# Patient Record
Sex: Female | Born: 1970
Health system: Southern US, Community
[De-identification: ages and names within clinical notes are randomized; demographics above are authoritative.]

## PROBLEM LIST (undated history)

## (undated) DIAGNOSIS — N739 Female pelvic inflammatory disease, unspecified: Secondary | ICD-10-CM

## (undated) DIAGNOSIS — J45909 Unspecified asthma, uncomplicated: Secondary | ICD-10-CM

## (undated) DIAGNOSIS — D219 Benign neoplasm of connective and other soft tissue, unspecified: Secondary | ICD-10-CM

## (undated) DIAGNOSIS — I10 Essential (primary) hypertension: Secondary | ICD-10-CM

## (undated) DIAGNOSIS — J449 Chronic obstructive pulmonary disease, unspecified: Secondary | ICD-10-CM

## (undated) DIAGNOSIS — A630 Anogenital (venereal) warts: Secondary | ICD-10-CM

## (undated) DIAGNOSIS — N289 Disorder of kidney and ureter, unspecified: Secondary | ICD-10-CM

## (undated) HISTORY — PX: OTHER SURGICAL HISTORY: SHX169

## (undated) HISTORY — DX: Female pelvic inflammatory disease, unspecified: N73.9

## (undated) HISTORY — DX: Benign neoplasm of connective and other soft tissue, unspecified: D21.9

## (undated) HISTORY — DX: Essential (primary) hypertension: I10

## (undated) HISTORY — PX: LAPAROSCOPIC TOTAL HYSTERECTOMY: SUR800

## (undated) HISTORY — DX: Anogenital (venereal) warts: A63.0

## (undated) HISTORY — PX: DILATION AND CURETTAGE OF UTERUS: SHX78

## (undated) HISTORY — PX: COLPOSCOPY: SHX161

---

## 2015-10-01 DIAGNOSIS — I10 Essential (primary) hypertension: Secondary | ICD-10-CM | POA: Insufficient documentation

## 2015-10-01 DIAGNOSIS — K219 Gastro-esophageal reflux disease without esophagitis: Secondary | ICD-10-CM | POA: Insufficient documentation

## 2016-10-05 ENCOUNTER — Emergency Department (HOSPITAL_COMMUNITY): Payer: 59

## 2016-10-05 ENCOUNTER — Emergency Department (HOSPITAL_COMMUNITY)
Admission: EM | Admit: 2016-10-05 | Discharge: 2016-10-05 | Disposition: A | Discharge status: Home / Self Care | Payer: 59 | Attending: Emergency Medicine | Admitting: Emergency Medicine

## 2016-10-05 ENCOUNTER — Encounter (HOSPITAL_COMMUNITY): Payer: Self-pay | Admitting: Emergency Medicine

## 2016-10-05 DIAGNOSIS — J4521 Mild intermittent asthma with (acute) exacerbation: Secondary | ICD-10-CM | POA: Insufficient documentation

## 2016-10-05 DIAGNOSIS — R1032 Left lower quadrant pain: Secondary | ICD-10-CM | POA: Diagnosis present

## 2016-10-05 DIAGNOSIS — J449 Chronic obstructive pulmonary disease, unspecified: Secondary | ICD-10-CM | POA: Diagnosis not present

## 2016-10-05 DIAGNOSIS — N12 Tubulo-interstitial nephritis, not specified as acute or chronic: Secondary | ICD-10-CM | POA: Diagnosis not present

## 2016-10-05 DIAGNOSIS — Z79899 Other long term (current) drug therapy: Secondary | ICD-10-CM | POA: Insufficient documentation

## 2016-10-05 HISTORY — DX: Unspecified asthma, uncomplicated: J45.909

## 2016-10-05 HISTORY — DX: Chronic obstructive pulmonary disease, unspecified: J44.9

## 2016-10-05 LAB — COMPREHENSIVE METABOLIC PANEL
ALBUMIN: 4 g/dL (ref 3.5–5.0)
ALT: 16 U/L (ref 14–54)
ANION GAP: 6 (ref 5–15)
AST: 17 U/L (ref 15–41)
Alkaline Phosphatase: 49 U/L (ref 38–126)
BILIRUBIN TOTAL: 0.6 mg/dL (ref 0.3–1.2)
BUN: 12 mg/dL (ref 6–20)
CO2: 25 mmol/L (ref 22–32)
Calcium: 9.4 mg/dL (ref 8.9–10.3)
Chloride: 109 mmol/L (ref 101–111)
Creatinine, Ser: 0.97 mg/dL (ref 0.44–1.00)
GFR calc Af Amer: >60 mL/min (ref >60)
GFR calc non Af Amer: >60 mL/min (ref >60)
GLUCOSE: 93 mg/dL (ref 65–99)
POTASSIUM: 3.5 mmol/L (ref 3.5–5.1)
Sodium: 140 mmol/L (ref 135–145)
TOTAL PROTEIN: 7.6 g/dL (ref 6.5–8.1)

## 2016-10-05 LAB — CBC
HEMATOCRIT: 41.3 % (ref 36.0–46.0)
HEMOGLOBIN: 14.3 g/dL (ref 12.0–15.0)
MCH: 30.6 pg (ref 26.0–34.0)
MCHC: 34.6 g/dL (ref 30.0–36.0)
MCV: 88.2 fL (ref 78.0–100.0)
Platelets: 326 10*3/uL (ref 150–400)
RBC: 4.68 MIL/uL (ref 3.87–5.11)
RDW: 13.5 % (ref 11.5–15.5)
WBC: 25.1 10*3/uL — AB (ref 4.0–10.5)

## 2016-10-05 LAB — URINALYSIS, ROUTINE W REFLEX MICROSCOPIC
Glucose, UA: NEGATIVE mg/dL
Ketones, ur: NEGATIVE mg/dL
NITRITE: POSITIVE — AB
PH: 6 (ref 5.0–8.0)
Protein, ur: >300 mg/dL — AB
SPECIFIC GRAVITY, URINE: 1.036 — AB (ref 1.005–1.030)

## 2016-10-05 LAB — WET PREP, GENITAL
Sperm: NONE SEEN
TRICH WET PREP: NONE SEEN
YEAST WET PREP: NONE SEEN

## 2016-10-05 LAB — I-STAT BETA HCG BLOOD, ED (MC, WL, AP ONLY): I-stat hCG, quantitative: <5 m[IU]/mL (ref <5)

## 2016-10-05 LAB — LIPASE, BLOOD: Lipase: 14 U/L (ref 11–51)

## 2016-10-05 LAB — URINE MICROSCOPIC-ADD ON

## 2016-10-05 MED ORDER — FLUCONAZOLE 150 MG PO TABS: 150.0000 mg | ORAL_TABLET | Freq: Every day | ORAL | 0 refills | Status: DC

## 2016-10-05 MED ORDER — ALBUTEROL SULFATE (2.5 MG/3ML) 0.083% IN NEBU
5.0000 mg | INHALATION_SOLUTION | Freq: Once | RESPIRATORY_TRACT | Status: AC
  Administered 2016-10-05: 5 mg via RESPIRATORY_TRACT
  Filled 2016-10-05: qty 6

## 2016-10-05 MED ORDER — DEXTROSE 5 % IV SOLN
1.0000 g | Freq: Once | INTRAVENOUS | Status: AC
  Administered 2016-10-05: 1 g via INTRAVENOUS
  Filled 2016-10-05: qty 10

## 2016-10-05 MED ORDER — ONDANSETRON HCL 4 MG PO TABS: 4.0000 mg | ORAL_TABLET | Freq: Three times a day (TID) | ORAL | 0 refills | Status: DC | PRN

## 2016-10-05 MED ORDER — SULFAMETHOXAZOLE-TRIMETHOPRIM 800-160 MG PO TABS: 1.0000 | ORAL_TABLET | Freq: Two times a day (BID) | ORAL | 0 refills | Status: AC

## 2016-10-05 NOTE — ED Provider Notes (Signed)
[place] DEPT Provider Note   CSN: [id number] Arrival date & time: 10/05/16  1326     History   Chief Complaint Chief Complaint  Patient presents with  . Hematuria  . Wheezing    HPI Deborah Cordova is a 45 y.o. female.  HPI   45 year old female with history of asthma and COPD presents with multiple complaints. Patient report this morning when urinating she noticed specks of blood when she wipes. She also urinates several times each time with small amount of blood. She complaining of having sharp pain to the left groin and radiates to her left low pelvis intermittently for the past 2-3 days prior to these episodes.  Nothing suspect the pain better or worse. Pain is associated with occasional bouts of dizziness and mild throbbing headache. Furthermore, patient report increased wheezing with occasional coughing and shortness of breath for the past week, having to use the inhaler more frequently. Denies fever, chills, change in appetite, vaginal bleeding, vaginal discharge, or rash. Report remote history of kidney stones.  Past Medical History:  Diagnosis Date  . Asthma   . COPD (chronic obstructive pulmonary disease) (HCC)     There are no active problems to display for this patient.   No past surgical history on file.  OB History    No data available       Home Medications    Prior to Admission medications   Not on File    Family History No family history on file.  Social History Social History  Substance Use Topics  . Smoking status: Not on file  . Smokeless tobacco: Not on file  . Alcohol use Not on file     Allergies   Aspirin and Nsaids   Review of Systems Review of Systems  All other systems reviewed and are negative.    Physical Exam Updated Vital Signs BP 142/81 (BP Location: Right Arm)   Pulse 89   Temp 98.8 F (37.1 C) (Oral)   Resp 18   SpO2 100%   Physical Exam  Constitutional: She appears well-developed and well-nourished.  No distress.  HENT:  Head: Atraumatic.  Right Ear: External ear normal.  Left Ear: External ear normal.  Nose: Nose normal.  Mouth/Throat: Oropharynx is clear and moist.  Eyes: Conjunctivae are normal.  Neck: Neck supple.  Cardiovascular: Normal rate and regular rhythm.   Pulmonary/Chest: She has wheezes (Faint scattered wheezes, no rales or rhonchi heard).  Abdominal: Soft. There is tenderness (Tenderness to suprapubic and left lower quadrant on palpation, no guarding rebound tenderness.).  Genitourinary:  Genitourinary Comments: Bilateral CVA tenderness on percussion.  Chaperone and present during exam. No inguinal lymphadenopathy or inguinal hernia noted. Normal external genitalia. No pain with speculum insertion. White functional discharge noted in vaginal vault. Cervix is absent. Mild Left adnexal tenderness.  No blood visualized.  Neurological: She is alert.  Skin: No rash noted.  Psychiatric: She has a normal mood and affect.  Nursing note and vitals reviewed.    ED Treatments / Results  Labs (all labs ordered are listed, but only abnormal results are displayed) Labs Reviewed  CBC - Abnormal; Notable for the following:       Result Value   WBC 25.1 (*)    All other components within normal limits  URINALYSIS, ROUTINE W REFLEX MICROSCOPIC (NOT AT [place]) - Abnormal; Notable for the following:    Color, Urine RED (*)    APPearance TURBID (*)    Specific Gravity, Urine 1.036 (*)  Hgb urine dipstick LARGE (*)    Bilirubin Urine MODERATE (*)    Protein, ur >300 (*)    Nitrite POSITIVE (*)    Leukocytes, UA SMALL (*)    All other components within normal limits  URINE MICROSCOPIC-ADD ON - Abnormal; Notable for the following:    Squamous Epithelial / LPF 6-30 (*)    Bacteria, UA MANY (*)    All other components within normal limits  WET PREP, GENITAL  URINE CULTURE  LIPASE, BLOOD  COMPREHENSIVE METABOLIC PANEL  I-STAT BETA HCG BLOOD, ED ([facility], [facility], [facility] ONLY)    GC/CHLAMYDIA PROBE AMP ([place]) NOT AT [facility]    EKG  EKG Interpretation None       Radiology Dg Chest 2 View  Result Date: 10/05/2016 CLINICAL DATA:  Dizziness, headache, shortness of breath cough and congestion. EXAM: CHEST  2 VIEW COMPARISON:  None. FINDINGS: The heart size and mediastinal contours are within normal limits. Both lungs are clear. The visualized skeletal structures are unremarkable. IMPRESSION: No active cardiopulmonary disease. Electronically Signed   By: Fidela Salisbury M.D.   On: 10/05/2016 14:27   Ct Renal Stone Study  Result Date: 10/05/2016 CLINICAL DATA:  Cough of several days duration. Left lower quadrant abdominal pain with dizziness and headache. Gross hematuria. EXAM: CT ABDOMEN AND PELVIS WITHOUT CONTRAST TECHNIQUE: Multidetector CT imaging of the abdomen and pelvis was performed following the standard protocol without IV contrast. COMPARISON:  None. FINDINGS: Lower chest: Lung bases are clear.  No pleural or pericardial fluid. Hepatobiliary: Normal Pancreas: Normal Spleen: Normal Adrenals/Urinary Tract: Normal Stomach/Bowel: Normal Vascular/Lymphatic: Normal Reproductive: Previous hysterectomy. Small amount of free fluid in the pelvis. Other: None significant Musculoskeletal: Negative IMPRESSION: No evidence of urinary tract pathology.  No stone disease. Small amount of free fluid in the pelvic cul de sac. In a person of this age, this may be secondary to a ruptured ovarian cyst. No other intra abdominal pathology evident to explain symptoms or fluid. Electronically Signed   By: Nelson Chimes M.D.   On: 10/05/2016 16:05    Procedures Procedures (including critical care time)  Medications Ordered in ED Medications  cefTRIAXone (ROCEPHIN) 1 g in dextrose 5 % 50 mL IVPB (not administered)  albuterol (PROVENTIL) (2.5 MG/3ML) 0.083% nebulizer solution 5 mg (5 mg Nebulization Given 10/05/16 1400)     Initial Impression / Assessment and Plan / ED Course   I have reviewed the triage vital signs and the nursing notes.  Pertinent labs & imaging results that were available during my care of the patient were reviewed by me and considered in my medical decision making (see chart for details).  Clinical Course    BP 125/61 (BP Location: Right Arm)   Pulse 72   Temp 98.8 F (37.1 C) (Oral)   Resp 16   SpO2 100%    Final Clinical Impressions(s) / ED Diagnoses   Final diagnoses:  Pyelonephritis  Mild intermittent asthma with exacerbation    New Prescriptions New Prescriptions   ONDANSETRON (ZOFRAN) 4 MG TABLET    Take 1 tablet (4 mg total) by mouth every 8 (eight) hours as needed for nausea or vomiting.   SULFAMETHOXAZOLE-TRIMETHOPRIM (BACTRIM DS,SEPTRA DS) 800-160 MG TABLET    Take 1 tablet by mouth 2 (two) times daily.   2:47 PM Patient with multiple complaints. Her primary concern is hematuria. She does have some tenderness to her low abdomen and CVA tenderness. Report remote history of kidney stone, which check UA and perform  CT renal stone study. She also has history of COPD and asthma and has been complaining of wheezing cough for the past week. A chest x-ray is negative for pneumonia, which she is in no acute respiratory discomfort and maintain oxygenation of 100% some room air. Reassurance given.  4:31 PM Patient has elevated white count of 25.1. Her urine shows evidence consistence with a urinary tract infection which explains symptoms. Says she has low back pain, infected urine, and elevated white count, she will be diagnosed with pyelonephritis. CT scan of abd/pelvis without acute finding. Patient receiving Rocephin IV in the ED. Patient does not wants to be admitted, she is not actively vomiting, and she felt comfortable going home. She is afebrile with stable normal vital sign. Care discussed with oncoming provider who will discharge patient once she received antibiotic. At this time, wet prep has not resulted yet.   Domenic Moras,  PA-C 10/05/16 1638    Milton Ferguson, MD 10/06/16 1434

## 2016-10-05 NOTE — Discharge Instructions (Signed)
You have been evaluated for your symptoms.  You have been diagnosed with having kidney infection.  Please take antibiotic as prescribed for the full duration.  Take zofran as needed for nausea.  Continue to use your inhaler for your asthma exacerbation.  Return to the ER if your condition worsen or if you have other concerns.

## 2016-10-05 NOTE — ED Triage Notes (Signed)
Pt c/o cough x several days, wheezing x 4 days, lower LLQ abdominal pain x 3 days, dizziness, headache. This morning new onset sharp pain radiating from groin to lower abdomen and bright red blood clots in urine. No dysuria.

## 2016-10-06 LAB — GC/CHLAMYDIA PROBE AMP (~~LOC~~) NOT AT ARMC
CHLAMYDIA, DNA PROBE: NEGATIVE
NEISSERIA GONORRHEA: NEGATIVE

## 2016-10-08 LAB — URINE CULTURE

## 2016-10-09 ENCOUNTER — Telehealth (HOSPITAL_BASED_OUTPATIENT_CLINIC_OR_DEPARTMENT_OTHER): Payer: Self-pay | Admitting: Emergency Medicine

## 2016-10-09 NOTE — Progress Notes (Signed)
ED Antimicrobial Stewardship Positive Culture Follow Up   Deborah Cordova is an 45 y.o. female who presented to [facility] on 10/05/2016 with a chief complaint of  Chief Complaint  Patient presents with  . Hematuria  . Wheezing    Recent Results (from the past 720 hour(s))  Urine culture     Status: Abnormal   Collection Time: 10/05/16  1:55 PM  Result Value Ref Range Status   Specimen Description URINE, RANDOM  Final   Special Requests NONE  Final   Culture >=100,000 COLONIES/mL ESCHERICHIA COLI (A)  Final   Report Status 10/08/2016 FINAL  Final   Organism ID, Bacteria ESCHERICHIA COLI (A)  Final      Susceptibility   Escherichia coli - MIC*    AMPICILLIN >=32 RESISTANT Resistant     CEFAZOLIN <=4 SENSITIVE Sensitive     CEFTRIAXONE <=1 SENSITIVE Sensitive     CIPROFLOXACIN <=0.25 SENSITIVE Sensitive     GENTAMICIN <=1 SENSITIVE Sensitive     IMIPENEM <=0.25 SENSITIVE Sensitive     NITROFURANTOIN <=16 SENSITIVE Sensitive     TRIMETH/SULFA >=320 RESISTANT Resistant     AMPICILLIN/SULBACTAM 16 INTERMEDIATE Intermediate     PIP/TAZO <=4 SENSITIVE Sensitive     Extended ESBL NEGATIVE Sensitive     * >=100,000 COLONIES/mL ESCHERICHIA COLI  Wet prep, genital     Status: Abnormal   Collection Time: 10/05/16  4:11 PM  Result Value Ref Range Status   Yeast Wet Prep HPF POC NONE SEEN NONE SEEN Final   Trich, Wet Prep NONE SEEN NONE SEEN Final   Clue Cells Wet Prep HPF POC PRESENT (A) NONE SEEN Final   WBC, Wet Prep HPF POC FEW (A) NONE SEEN Final   Sperm NONE SEEN  Final    [x]  Treated with Bactrim, organism resistant to prescribed antimicrobial  New antibiotic prescription: Stop Bactrim. Start ciprofloxacin 500 mg PO BID for 7 days.  ED Provider: Janetta Hora, PA-C   Gwenlyn Perking, PharmD PGY1 Pharmacy Resident Pager: [phone] 10/09/2016 8:55 AM

## 2016-10-09 NOTE — Telephone Encounter (Signed)
Post ED Visit - Positive Culture Follow-up: Successful Patient Follow-Up  Culture assessed and recommendations reviewed by: []  Elenor Quinones, Pharm.D. []  Heide Guile, Pharm.D., BCPS []  Parks Neptune, Pharm.D. []  Alycia Rossetti, Pharm.D., BCPS []  Dexter, Pharm.D., BCPS, AAHIVP []  Legrand Como, Pharm.D., BCPS, AAHIVP []  Milus Glazier, Pharm.D. []  Stephens November, Pharm.D.  Positive culture  []  Patient discharged without antimicrobial prescription and treatment is now indicated [x]  Organism is resistant to prescribed ED discharge antimicrobial []  Patient with positive blood cultures  Changes discussed with ED provider: Janetta Hora PA New antibiotic prescription stop Bactrim, Start Ciprofloxacin 500mg  po bid x 7 days Called to [place]  10/09/2016 1506   Deborah Cordova 10/09/2016, 3:03 PM

## 2016-10-23 ENCOUNTER — Encounter: Payer: Self-pay | Admitting: Obstetrics and Gynecology

## 2016-10-24 ENCOUNTER — Telehealth: Payer: Self-pay | Admitting: *Deleted

## 2016-10-24 NOTE — Telephone Encounter (Signed)
Spoke with patient in reference to MyChart message as seen below. Patient sates she is having the same lower abdominal pain, has been ongoing since Hysterectomy 05/19/16. Patient states seen previous physician who preformed surgery and was advised pain was due to bladder resting on sutures. Patient states pain is worse with urination and bowel movements, pressure makes it worse, lifts stomach for relief.  Patient states she is new to area, went to ER 10/05/16 for this pain was treated for "kidney infection" with bactrimDS x7d and had "CT that showed fluid in abdomen r/t ruptured cyst" . Patient states no further recommendations made at that time. Patient states she has history of hpylori and thinks pain may be related. Patient states she has nausea daily. Reports belching a lot. Patient states not taking anything for pain relief. Recommended changing appointment time to earlier appt, offered patient appointment for 10/28/16 at 3:30pm with Dr. Talbert Nan, patient declined due to work schedule. Patient states only available 10/30 and 10/31 in the mornings -no appointments available. Patient states she will keep current appt for 10/30/16 with Dr. Talbert Nan. Advised patient should pain get worse, vomiting, fever -visit local ER or Urgent Care after hours or return call to office for questions/concerns. Patient verbalizes understanding and is agreeable.  Routing to provider for final review. Patient is agreeable to disposition. Will close encounter.    Cc: Dr. Talbert Nan

## 2016-10-24 NOTE — Telephone Encounter (Signed)
Dr. Quincy Simmonds, patient has new patient appt scheduled for 10/30/16 with Dr. Talbert Nan, does she need to be seen before?    From Deborah Cordova To Deborah Dom, MD Sent 10/23/2016 10:32 AM  I have been experiencing lower abdominal pain for about a month, I get nauseated at times as well and I'm very concerned about this. I had a hysterectomy in May and wonder if this is the source or if it could be Hpylori, I had this before years ago when I lived in [place]. This is my main issue for coming in outside of getting established. I had a CT done October 8th at [facility] and was told I have fluid in my belly, which may have came from a ruptured cyst and could be the reason for my pain. I just want this issue resolved because it doesn't feel good. Thanks and I look forward to meeting you.    Cc: Dr. Talbert Nan

## 2016-10-24 NOTE — Telephone Encounter (Signed)
Encounter to clinical supervisor. Patient has not yet established care.

## 2016-10-24 NOTE — Telephone Encounter (Signed)
See telephone encounter dated 10/24/16. Will close encounter

## 2016-10-24 NOTE — Telephone Encounter (Signed)
Try to move up her appointment with Dr. Talbert Nan for the beginning of the week.  Difficult for [place] to give advice to her without being seen in the office and assessed.  Cc- Dr. Talbert Nan

## 2016-10-24 NOTE — Telephone Encounter (Signed)
Left message to call Clorine Swing at [phone].  

## 2016-10-26 ENCOUNTER — Encounter (HOSPITAL_COMMUNITY): Payer: Self-pay | Admitting: Oncology

## 2016-10-26 ENCOUNTER — Emergency Department (HOSPITAL_COMMUNITY)
Admission: EM | Admit: 2016-10-26 | Discharge: 2016-10-27 | Disposition: A | Discharge status: Home / Self Care | Payer: 59 | Attending: Emergency Medicine | Admitting: Emergency Medicine

## 2016-10-26 DIAGNOSIS — R102 Pelvic and perineal pain: Secondary | ICD-10-CM | POA: Insufficient documentation

## 2016-10-26 DIAGNOSIS — R110 Nausea: Secondary | ICD-10-CM | POA: Insufficient documentation

## 2016-10-26 DIAGNOSIS — Z79899 Other long term (current) drug therapy: Secondary | ICD-10-CM | POA: Diagnosis not present

## 2016-10-26 DIAGNOSIS — Z87891 Personal history of nicotine dependence: Secondary | ICD-10-CM | POA: Insufficient documentation

## 2016-10-26 DIAGNOSIS — J449 Chronic obstructive pulmonary disease, unspecified: Secondary | ICD-10-CM | POA: Diagnosis not present

## 2016-10-26 DIAGNOSIS — R109 Unspecified abdominal pain: Secondary | ICD-10-CM | POA: Diagnosis present

## 2016-10-26 MED ORDER — MORPHINE SULFATE (PF) 2 MG/ML IV SOLN
4.0000 mg | Freq: Once | INTRAVENOUS | Status: AC
Start: 2016-10-27 — End: 2016-10-27
  Administered 2016-10-27: 4 mg via INTRAVENOUS
  Filled 2016-10-26: qty 2

## 2016-10-26 MED ORDER — ONDANSETRON HCL 4 MG/2ML IJ SOLN
4.0000 mg | Freq: Once | INTRAMUSCULAR | Status: AC
  Administered 2016-10-27: 4 mg via INTRAVENOUS
  Filled 2016-10-26: qty 2

## 2016-10-26 MED ORDER — SODIUM CHLORIDE 0.9 % IV BOLUS (SEPSIS)
1000.0000 mL | Freq: Once | INTRAVENOUS | Status: DC
  Administered 2016-10-27: 1000 mL via INTRAVENOUS

## 2016-10-26 NOTE — ED Triage Notes (Signed)
Pt c/o left sided flank/back pain.  Pt recently dx w/ kidney infection.  +nausea.

## 2016-10-26 NOTE — ED Provider Notes (Signed)
[place] DEPT Provider Note   CSN: [id number] Arrival date & time: 10/26/16  2302     History   Chief Complaint Chief Complaint  Patient presents with  . Flank Pain     HPI   Blood pressure 142/90, pulse 91, temperature 97.7 F (36.5 C), temperature source Oral, resp. rate 18, SpO2 99 %.  Deborah Cordova is a 45 y.o. female complaining of left lower quadrant pain intermittently onset one month ago, she also has left flank pain recent restarted in the last day. She denies fever, chills endorses nausea with no vomiting, denies abnormal vaginal discharge, diarrhea, melena, hematochezia. States that she was seen 3 weeks ago and diagnosed with pyelonephritis, she completed her course of antibiotics and had improvement in her symptoms. States that she's had lower abdominal pain for 5 months status post hysterectomy: It is normally on the right side however.   Past Medical History:  Diagnosis Date  . Asthma   . COPD (chronic obstructive pulmonary disease) (HCC)     There are no active problems to display for this patient.   Past Surgical History:  Procedure Laterality Date  . ABDOMINAL HYSTERECTOMY      OB History    No data available       Home Medications    Prior to Admission medications   Medication Sig Start Date End Date Taking? Authorizing Provider  acetaminophen (TYLENOL) 500 MG tablet Take 1,000 mg by mouth every 6 (six) hours as needed for headache.   Yes Historical Provider, MD  acyclovir (ZOVIRAX) 400 MG tablet Take 400 mg by mouth as needed (For fever blisters.).   Yes Historical Provider, MD  albuterol (PROAIR HFA) 108 (90 Base) MCG/ACT inhaler Inhale 2 puffs into the lungs every 4 (four) hours as needed for wheezing or shortness of breath.   Yes Historical Provider, MD  amLODipine (NORVASC) 5 MG tablet Take 5 mg by mouth daily.   Yes Historical Provider, MD  butalbital-acetaminophen-caffeine (FIORICET, ESGIC) 50-325-40 MG tablet Take 0.5 tablets by  mouth 2 (two) times daily as needed for headache.   Yes Historical Provider, MD  cetirizine (ZYRTEC ALLERGY) 10 MG tablet Take 10 mg by mouth daily.   Yes Historical Provider, MD  gabapentin (NEURONTIN) 300 MG capsule Take 300 mg by mouth daily as needed (restless leg).   Yes Historical Provider, MD  montelukast (SINGULAIR) 10 MG tablet Take 10 mg by mouth daily.   Yes Historical Provider, MD  omalizumab Arvid Right) 150 MG injection Inject 300 mg into the skin every 14 (fourteen) days.   Yes Historical Provider, MD  ondansetron (ZOFRAN) 4 MG tablet Take 1 tablet (4 mg total) by mouth every 8 (eight) hours as needed for nausea or vomiting. 10/05/16  Yes Domenic Moras, PA-C  ranitidine (ZANTAC) 150 MG tablet Take 150 mg by mouth 2 (two) times daily.   Yes Historical Provider, MD  tiotropium (SPIRIVA) 18 MCG inhalation capsule Place 18 mcg into inhaler and inhale daily.   Yes Historical Provider, MD  fluconazole (DIFLUCAN) 150 MG tablet Take 1 tablet (150 mg total) by mouth daily. Patient not taking: Reported on 10/27/2016 10/05/16   Carlisle Cater, PA-C    Family History No family history on file.  Social History Social History  Substance Use Topics  . Smoking status: Former Research scientist (life sciences)  . Smokeless tobacco: Never Used  . Alcohol use Yes     Allergies   Aspirin; Cantaloupe (diagnostic); Kiwi extract; Nsaids; Other; and Pineapple   Review of Systems  Review of Systems  10 systems reviewed and found to be negative, except as noted in the HPI.   Physical Exam Updated Vital Signs BP 142/90 (BP Location: Left Arm)   Pulse 91   Temp 97.7 F (36.5 C) (Oral)   Resp 18   SpO2 99%   Physical Exam  Constitutional: She is oriented to person, place, and time. She appears well-developed and well-nourished. No distress.  HENT:  Head: Normocephalic and atraumatic.  Mouth/Throat: Oropharynx is clear and moist.  Eyes: Conjunctivae and EOM are normal. Pupils are equal, round, and reactive to light.    Neck: Normal range of motion.  Cardiovascular: Normal rate, regular rhythm and intact distal pulses.   Pulmonary/Chest: Effort normal and breath sounds normal.  Abdominal: Soft. There is tenderness.  Tender to palpation in the suprapubic and left lower quadrant without guarding or rebound, mild left CVA tenderness to percussion.  Musculoskeletal: Normal range of motion.  Neurological: She is alert and oriented to person, place, and time.  Skin: She is not diaphoretic.  Psychiatric: She has a normal mood and affect.  Nursing note and vitals reviewed.    ED Treatments / Results  Labs (all labs ordered are listed, but only abnormal results are displayed) Labs Reviewed  CBC WITH DIFFERENTIAL/PLATELET - Abnormal; Notable for the following:       Result Value   WBC 11.0 (*)    Lymphs Abs 4.4 (*)    All other components within normal limits  WET PREP, GENITAL  URINALYSIS, ROUTINE W REFLEX MICROSCOPIC (NOT AT [facility])  COMPREHENSIVE METABOLIC PANEL  LIPASE, BLOOD  GC/CHLAMYDIA PROBE AMP ([place]) NOT AT [facility]    EKG  EKG Interpretation None       Radiology No results found.  Procedures Procedures (including critical care time)  Medications Ordered in ED Medications  sodium chloride 0.9 % bolus 1,000 mL (1,000 mLs Intravenous Not Given 10/27/16 0106)  morphine 2 MG/ML injection 4 mg (4 mg Intravenous Given 10/27/16 0041)  ondansetron (ZOFRAN) injection 4 mg (4 mg Intravenous Given 10/27/16 0039)     Initial Impression / Assessment and Plan / ED Course  I have reviewed the triage vital signs and the nursing notes.  Pertinent labs & imaging results that were available during my care of the patient were reviewed by me and considered in my medical decision making (see chart for details).  Clinical Course    Vitals:   10/26/16 2312  BP: 142/90  Pulse: 91  Resp: 18  Temp: 97.7 F (36.5 C)  TempSrc: Oral  SpO2: 99%    Medications  sodium chloride 0.9 % bolus  1,000 mL (1,000 mLs Intravenous Not Given 10/27/16 0106)  morphine 2 MG/ML injection 4 mg (4 mg Intravenous Given 10/27/16 0041)  ondansetron (ZOFRAN) injection 4 mg (4 mg Intravenous Given 10/27/16 0039)    Deborah Cordova is 45 y.o. female presenting with Left lower abdominal pain radiating up to the left flank, abdominal exam with tenderness, no surgical signs. No diarrhea to suggest a diverticulitis. Urinalysis is without infection, blood work reassuring.  Case signed out to PA Focht at shift change plan is to perform pelvic exam and pelvic ultrasound. Recommend discharge to home with Vicodin.    Final Clinical Impressions(s) / ED Diagnoses   Final diagnoses:  None    New Prescriptions New Prescriptions   No medications on file     Monico Blitz, PA-C 10/27/16 0128    Orpah Greek, MD 10/27/16 [phone]

## 2016-10-27 ENCOUNTER — Emergency Department (HOSPITAL_COMMUNITY): Payer: 59

## 2016-10-27 ENCOUNTER — Encounter: Payer: Self-pay | Admitting: Obstetrics and Gynecology

## 2016-10-27 ENCOUNTER — Telehealth: Payer: Self-pay | Admitting: Obstetrics and Gynecology

## 2016-10-27 ENCOUNTER — Ambulatory Visit (INDEPENDENT_AMBULATORY_CARE_PROVIDER_SITE_OTHER): Payer: Managed Care, Other (non HMO) | Admitting: Obstetrics and Gynecology

## 2016-10-27 VITALS — BP 122/80 | HR 80 | Resp 16 | Ht 68.75 in | Wt 216.0 lb

## 2016-10-27 DIAGNOSIS — N760 Acute vaginitis: Secondary | ICD-10-CM | POA: Diagnosis not present

## 2016-10-27 DIAGNOSIS — R3989 Other symptoms and signs involving the genitourinary system: Secondary | ICD-10-CM

## 2016-10-27 DIAGNOSIS — J4550 Severe persistent asthma, uncomplicated: Secondary | ICD-10-CM | POA: Insufficient documentation

## 2016-10-27 DIAGNOSIS — J449 Chronic obstructive pulmonary disease, unspecified: Secondary | ICD-10-CM | POA: Insufficient documentation

## 2016-10-27 DIAGNOSIS — R102 Pelvic and perineal pain: Secondary | ICD-10-CM

## 2016-10-27 DIAGNOSIS — B9689 Other specified bacterial agents as the cause of diseases classified elsewhere: Secondary | ICD-10-CM | POA: Diagnosis not present

## 2016-10-27 DIAGNOSIS — N941 Unspecified dyspareunia: Secondary | ICD-10-CM | POA: Diagnosis not present

## 2016-10-27 DIAGNOSIS — L293 Anogenital pruritus, unspecified: Secondary | ICD-10-CM

## 2016-10-27 DIAGNOSIS — R339 Retention of urine, unspecified: Secondary | ICD-10-CM | POA: Diagnosis not present

## 2016-10-27 LAB — COMPREHENSIVE METABOLIC PANEL
ALBUMIN: 3.9 g/dL (ref 3.5–5.0)
ALT: 18 U/L (ref 14–54)
AST: 16 U/L (ref 15–41)
Alkaline Phosphatase: 51 U/L (ref 38–126)
Anion gap: 6 (ref 5–15)
BUN: 10 mg/dL (ref 6–20)
CHLORIDE: 107 mmol/L (ref 101–111)
CO2: 26 mmol/L (ref 22–32)
Calcium: 9.2 mg/dL (ref 8.9–10.3)
Creatinine, Ser: 0.65 mg/dL (ref 0.44–1.00)
GFR calc Af Amer: >60 mL/min (ref >60)
Glucose, Bld: 97 mg/dL (ref 65–99)
POTASSIUM: 3.5 mmol/L (ref 3.5–5.1)
SODIUM: 139 mmol/L (ref 135–145)
Total Bilirubin: 0.5 mg/dL (ref 0.3–1.2)
Total Protein: 7 g/dL (ref 6.5–8.1)

## 2016-10-27 LAB — URINALYSIS, ROUTINE W REFLEX MICROSCOPIC
BILIRUBIN URINE: NEGATIVE
Glucose, UA: NEGATIVE mg/dL
HGB URINE DIPSTICK: NEGATIVE
KETONES UR: NEGATIVE mg/dL
Leukocytes, UA: NEGATIVE
NITRITE: NEGATIVE
PROTEIN: NEGATIVE mg/dL
Specific Gravity, Urine: 1.016 (ref 1.005–1.030)
pH: 7 (ref 5.0–8.0)

## 2016-10-27 LAB — CBC WITH DIFFERENTIAL/PLATELET
BASOS ABS: 0.1 10*3/uL (ref 0.0–0.1)
BASOS PCT: 1 %
EOS ABS: 0.2 10*3/uL (ref 0.0–0.7)
EOS PCT: 2 %
HCT: 37.7 % (ref 36.0–46.0)
Hemoglobin: 12.8 g/dL (ref 12.0–15.0)
Lymphocytes Relative: 40 %
Lymphs Abs: 4.4 10*3/uL — ABNORMAL HIGH (ref 0.7–4.0)
MCH: 29.8 pg (ref 26.0–34.0)
MCHC: 34 g/dL (ref 30.0–36.0)
MCV: 87.7 fL (ref 78.0–100.0)
MONO ABS: 0.7 10*3/uL (ref 0.1–1.0)
Monocytes Relative: 6 %
Neutro Abs: 5.7 10*3/uL (ref 1.7–7.7)
Neutrophils Relative %: 51 %
PLATELETS: 333 10*3/uL (ref 150–400)
RBC: 4.3 MIL/uL (ref 3.87–5.11)
RDW: 13.2 % (ref 11.5–15.5)
WBC: 11 10*3/uL — ABNORMAL HIGH (ref 4.0–10.5)

## 2016-10-27 LAB — WET PREP, GENITAL
Sperm: NONE SEEN
Trich, Wet Prep: NONE SEEN
Yeast Wet Prep HPF POC: NONE SEEN

## 2016-10-27 LAB — I-STAT TROPONIN, ED: Troponin i, poc: 0 ng/mL (ref 0.00–0.08)

## 2016-10-27 LAB — LIPASE, BLOOD: LIPASE: 17 U/L (ref 11–51)

## 2016-10-27 MED ORDER — HYDROCODONE-ACETAMINOPHEN 5-325 MG PO TABS: 1.0000 | ORAL_TABLET | Freq: Four times a day (QID) | ORAL | 0 refills | Status: DC | PRN

## 2016-10-27 MED ORDER — SODIUM CHLORIDE 0.9 % IV BOLUS (SEPSIS)
500.0000 mL | Freq: Once | INTRAVENOUS | Status: AC
  Administered 2016-10-27: 500 mL via INTRAVENOUS

## 2016-10-27 MED ORDER — SODIUM CHLORIDE 0.9 % IV BOLUS (SEPSIS): 1000.0000 mL | Freq: Once | INTRAVENOUS | Status: DC

## 2016-10-27 MED ORDER — METRONIDAZOLE 500 MG PO TABS: 500.0000 mg | ORAL_TABLET | Freq: Two times a day (BID) | ORAL | 0 refills | Status: DC

## 2016-10-27 MED ORDER — DIPHENHYDRAMINE HCL 50 MG/ML IJ SOLN
25.0000 mg | Freq: Once | INTRAMUSCULAR | Status: AC
  Administered 2016-10-27: 25 mg via INTRAVENOUS
  Filled 2016-10-27: qty 1

## 2016-10-27 MED ORDER — BETAMETHASONE VALERATE 0.1 % EX OINT: TOPICAL_OINTMENT | CUTANEOUS | 0 refills | Status: DC

## 2016-10-27 MED ORDER — FLUCONAZOLE 150 MG PO TABS: 150.0000 mg | ORAL_TABLET | Freq: Once | ORAL | 0 refills | Status: AC

## 2016-10-27 MED ORDER — MORPHINE SULFATE (PF) 2 MG/ML IV SOLN
4.0000 mg | Freq: Once | INTRAVENOUS | Status: AC
  Administered 2016-10-27: 4 mg via INTRAVENOUS
  Filled 2016-10-27: qty 2

## 2016-10-27 NOTE — ED Notes (Signed)
Pt stated "the pain in my stomach is 2 and I don't really feel the pain in my back."

## 2016-10-27 NOTE — ED Notes (Signed)
US in with pt. 

## 2016-10-27 NOTE — ED Notes (Signed)
PA-C informed of pt itching.

## 2016-10-27 NOTE — ED Notes (Signed)
Pt now c/o itching.

## 2016-10-27 NOTE — Discharge Instructions (Signed)
Take the Norco as needed for pain and be sure to add a stool softener to prevent constipation. Call your OB/GYN today to inform them of your visit to the emergency department and keep your appointment for Thursday. Return to the emergency department if you experience uncontrolled pain, fevers, vomiting or any other concerning symptoms.

## 2016-10-27 NOTE — Telephone Encounter (Signed)
Spoke with patient. (Also see telephone encounter dated 10/24/16) Patient states was seen in ER 10/26/16 for left sided lower abdominal pain. Patient states PUS preformed and recommended follow-up with OB. Patient scheduled for New patient appointment 10/30/16 at 9:30 am with Dr. Talbert Cordova. Patient would like to be seen before this appointment. Patient states she is available for OV today, no appointment available for New GYN. Advised Dr. Talbert Cordova in surgery this morning, will review with Dr. Talbert Cordova and return call with recommendations. Patient is agreeable.    Dr. Talbert Cordova, please advise? There is an OV-15 min appointment time available for today? This is a new patient scheduled for New GYN appt on 10/30/16.

## 2016-10-27 NOTE — ED Notes (Signed)
RN to draw labs with start of IV

## 2016-10-27 NOTE — ED Notes (Signed)
Pt stated "I had a hysterectomy 5-22 and I've had stomach pain since.  I hurt in my left lower side and around in the lower part of my back."

## 2016-10-27 NOTE — Progress Notes (Signed)
45 y.o. [id number] MarriedAfrican AmericanF here for annual exam. She had a laparoscopic total hysterectomy with bilateral salpingectomies in 5/17 in [place], [place]. 6-8 weeks ago she started having a constant pain in her suprapubic region to LLQ. Worse with a full bladder, or if she has to have a BM. The pain can pulsate and throb. The constant pain is an ache. Pain ranges from a 7-10/10 in severity. Yesterday she started noticing some pain in her left flank pain, currently better.  She does c/o nausea for this whole 6-8 weeks off and on, able to eat. She has 3 soft BMs a day, long term. No fevers, no emesis, hurts in her abdomen to void. No frequency or urgency to void. No burning with urination.  She was seen in the ER yesterday with LLQ abdominal pain. U/S showed a normal right ovary and left ovary with a 2.2 cm complex cyst containing 1 smooth septum. No signs of torsion. Urine dip was negative. WBC was 11 (down from 25 3 weeks ago). She was noted to have BV in the ER, not treated, she denied abnormal vaginal d/c or odor. She does c/o genital pruritus She states her voiding hasn't been the same since her surgery. Yesterday the ultrasound tech told her that her bladder looked full right after she voided.  She hasn't been sexually active for the last several months secondary to pain.   She was treated for a pyelonephritis on 10/05/16.    No LMP recorded. Patient has had a hysterectomy.          Sexually active: Yes.    The current method of family planning is status post hysterectomy.    Exercising: Yes.    Walking treadmill Smoker:  Former smoker   Health Maintenance: Pap:  03/2016 WNL per patient  History of abnormal Pap:  yes MMG:  2016 WNL per patient Colonoscopy:  Never BMD:   Never TDaP:  09-08-16  Gardasil: N/A   reports that she has quit smoking. She has never used smokeless tobacco. She reports that she drinks about 0.6 - 1.8 oz of alcohol per week . She reports that she does not use  drugs.She just started working for [employer], she works in [employer]. She is a CMA.   Past Medical History:  Diagnosis Date  . Asthma   . COPD (chronic obstructive pulmonary disease) ([place])     Past Surgical History:  Procedure Laterality Date  . ABDOMINAL HYSTERECTOMY      Current Outpatient Prescriptions  Medication Sig Dispense Refill  . acetaminophen (TYLENOL) 500 MG tablet Take 1,000 mg by mouth every 6 (six) hours as needed for headache.    Marland Kitchen acyclovir (ZOVIRAX) 400 MG tablet Take 400 mg by mouth as needed (For fever blisters.).    Marland Kitchen albuterol (PROAIR HFA) 108 (90 Base) MCG/ACT inhaler Inhale 2 puffs into the lungs every 4 (four) hours as needed for wheezing or shortness of breath.    Marland Kitchen amLODipine (NORVASC) 5 MG tablet Take 5 mg by mouth daily.    . butalbital-acetaminophen-caffeine (FIORICET, ESGIC) 50-325-40 MG tablet Take 0.5 tablets by mouth 2 (two) times daily as needed for headache.    . cetirizine (ZYRTEC ALLERGY) 10 MG tablet Take 10 mg by mouth daily.    Marland Kitchen gabapentin (NEURONTIN) 300 MG capsule Take 300 mg by mouth daily as needed (restless leg).    Marland Kitchen HYDROcodone-acetaminophen (NORCO/VICODIN) 5-325 MG tablet Take 1-2 tablets by mouth every 6 (six) hours as needed. 24 tablet 0  .  montelukast (SINGULAIR) 10 MG tablet Take 10 mg by mouth daily.    Marland Kitchen omalizumab (XOLAIR) 150 MG injection Inject 300 mg into the skin every 14 (fourteen) days.    . ondansetron (ZOFRAN) 4 MG tablet Take 1 tablet (4 mg total) by mouth every 8 (eight) hours as needed for nausea or vomiting. 12 tablet 0  . ranitidine (ZANTAC) 150 MG tablet Take 150 mg by mouth 2 (two) times daily.    Marland Kitchen tiotropium (SPIRIVA) 18 MCG inhalation capsule Place 18 mcg into inhaler and inhale daily.     No current facility-administered medications for this visit.     No family history on file.  Review of Systems  Constitutional: Negative.   HENT: Negative.   Eyes: Negative.   Respiratory: Negative.    Cardiovascular: Negative.   Gastrointestinal: Negative.   Endocrine: Negative.   Genitourinary: Positive for pelvic pain.  Musculoskeletal: Negative.   Skin: Negative.   Allergic/Immunologic: Negative.   Neurological: Negative.   Psychiatric/Behavioral: Negative.     Exam:   BP 122/80 (BP Location: Left Arm, Patient Position: Standing, Cuff Size: Normal)   Pulse 80   Resp 16   Ht 5' 8.75" (1.746 m)   Wt 216 lb (98 kg)   BMI 32.13 kg/m   Weight change: @WEIGHTCHANGE @ Height:   Height: 5' 8.75" (174.6 cm)  Ht Readings from Last 3 Encounters:  10/27/16 5' 8.75" (1.746 m)    General appearance: alert, cooperative and appears stated age Head: Normocephalic, without obvious abnormality, atraumatic Neck: no adenopathy, supple, symmetrical, trachea midline and thyroid normal to inspection and palpation Lungs: clear to auscultation bilaterally Heart: regular rate and rhythm Abdomen: soft, very tender in the suprapubic region and the LLQ, no rebound no guarding. No masses, NABS, mildly distended.  Extremities: extremities normal, atraumatic, no cyanosis or edema Skin: Skin color, texture, turgor normal. No rashes or lesions Lymph nodes: Cervical, supraclavicular, and axillary nodes normal. No abnormal inguinal nodes palpated Neurologic: Grossly normal   Pelvic: External genitalia:  no lesions              Urethra:  normal appearing urethra with no masses, tenderness or lesions              Bartholins and Skenes: normal                 Vagina: normal appearing vagina with normal color and discharge, no lesions              Cervix: absent and cuff not well seen               Bimanual Exam:  Uterus:  uterus absent              Adnexa: no masses, tender in the left adnexal region. Less tender after draining her bladder.                Rectovaginal: Confirms               Anus:  normal sphincter tone, no lesions Bladder: very tender to palpation  The patient voided after the exam.  PVR was 190 cc After voiding BM exam still with mild bladder and left adnexal tenderness, but improved  Chaperone was present for exam.  A:  Pelvic pain, some improvement with draining her bladder, etiology of the pain is not clear    Left ovarian cyst, only 2.2 cm, one septation. No signs of torsion. Would not expect a cyst of this  size to cause this level of pain (no signs of bleeding into the cyst or rupture).   Urinary retention   Bacterial vaginitis  Genital pruritus  P:   Patient taught to self cath, recommended she do this until her PVR is <150  F/U on Thursday  She will need a f/u ultrasound in 2 months to f/u on the cyst  Will treat BV (diagnosed last night in the ER)  Patient requests diflucan, always gets a yeast infection with antibiotics  Valisone ointment for vulvar pruritus

## 2016-10-27 NOTE — Telephone Encounter (Signed)
Patient calling to give Sharee Pimple an update on how she is doing. Says she is worse today and just leaving the emergency room this morning.

## 2016-10-27 NOTE — Telephone Encounter (Signed)
Spoke with Deborah Cordova - 10/27/16 at 10:45am appt available with Dr. Talbert Nan. Advise patient may be delay due to Dr. Nelson Chimes being in surgery.    Spoke with patient, advised of available appt time and possible delay, patient verbalizes understanding and is agreeable.  Routing to provider for final review. Patient is agreeable to disposition. Will close encounter.

## 2016-10-27 NOTE — ED Notes (Signed)
Pt assisted to BR to collect specimen.

## 2016-10-27 NOTE — ED Provider Notes (Signed)
Physical Exam  BP 142/90 (BP Location: Left Arm)   Pulse 91   Temp 97.7 F (36.5 C) (Oral)   Resp 18   SpO2 99%   Physical Exam  Constitutional: She appears well-developed and well-nourished. No distress.  HENT:  Head: Normocephalic and atraumatic.  Eyes: Conjunctivae are normal.  Pulmonary/Chest: Effort normal. No respiratory distress.  Genitourinary:  Genitourinary Comments: Exam performed by Kalman Drape,  exam chaperoned Date: 10/27/2016 Pelvic exam: normal external genitalia without evidence of trauma. VULVA: normal appearing vulva with no masses, tenderness or lesion. VAGINA: normal appearing vagina with normal color and discharge, no lesions. CERVIX: normal appearing cervix without lesions, cervical motion tenderness absent, cervical os closed with out purulent discharge; vaginal discharge - white, creamy and thick, Wet prep and DNA probe for chlamydia and GC obtained.   ADNEXA: normal adnexa in size, very tender on the left with mild tenderness on the right and no masses UTERUS: uterus is normal size, shape, consistency and nontender.    Musculoskeletal: Normal range of motion.  Neurological: She is alert. Coordination normal.  Skin: Skin is warm and dry. She is not diaphoretic.  Psychiatric: She has a normal mood and affect. Her behavior is normal.  Nursing note and vitals reviewed.   ED Course  Procedures  Lower abd pain. Radiates up to left flank. No associated symptoms. Was treated a few weeks ago for pyleo. UA today normal. Needs a pelvic exam and pelvic US. Hx of abd hysterectomy in May 2017. Pt states left sided pain since worsening over the last week.   Plan pelvic exam and possible ultrasound. Appt with OB/GYN this thursday. Will d/c with Norco.    EXAM:  TRANSABDOMINAL AND TRANSVAGINAL ULTRASOUND OF PELVIS    DOPPLER ULTRASOUND OF OVARIES    TECHNIQUE:  Both transabdominal and transvaginal ultrasound examinations of the  pelvis were performed.  Transabdominal technique was performed for  global imaging of the pelvis including uterus, ovaries, adnexal  regions, and pelvic cul-de-sac.    It was necessary to proceed with endovaginal exam following the  transabdominal exam to visualize the ovaries. Color and duplex  Doppler ultrasound was utilized to evaluate blood flow to the  ovaries.    COMPARISON: CT 10/05/2016    FINDINGS:  Uterus    Hysterectomy    Right ovary    Measurements: 2.7 x 1.7 x 2.4 cm. Normal appearance/no adnexal mass.    Left ovary    Measurements: 3.6 x 3.6 x 3.4 cm. 2.2 cm complex cyst, containing a  smooth septation.    Pulsed Doppler evaluation of both ovaries demonstrates normal  low-resistance arterial and venous waveforms.    Other findings    Small volume free pelvic fluid.    IMPRESSION:  2.2 cm mildly complex left adnexal cyst. Small volume free pelvic  fluid.      Electronically Signed  By: Andreas Newport M.D.  On: 10/27/2016 04:11     MDM  Patient with left-sided lower abdominal pain. Patient states she's had the pain since her hysterectomy in May 2017 that has progressive worsened in the last week. Labs unremarkable.  UA normal no hematuria less concerning for kidney stone. Wet prep revealed clue cells although patient is asymptomatic. Will not treated for BV at this time. Due to patient's adnexal tenderness on pelvic exam ultrasound was ordered. Ultrasound revealed mildy complex 2.2 cm cyst on left ovary and small volume of free pelvic fluid. No need for further imaging or workup at  this time. This is likely the source of the pt's pain although pt has had this pain since her hysterectomy. Patient afebrile, VSS. Close follow-up with OB/GYN is warranted. Patient has an appointment with her OB/GYN this Thursday. Instructed her to keep this appointment for follow-up. Patient will be discharged with Norco and instructed to take stool softener to avoid constipation.  Discussed strict return precautions to the ED. Patient expressed understanding to the discharge instructions.  Patient case discussed with Dr. Betsey Holiday who agrees with the above plan.        Kalman Drape, PA 10/27/16 0507    Orpah Greek, MD 10/27/16 [phone]

## 2016-10-28 LAB — GC/CHLAMYDIA PROBE AMP (~~LOC~~) NOT AT ARMC
Chlamydia: NEGATIVE
Neisseria Gonorrhea: NEGATIVE

## 2016-10-28 NOTE — Telephone Encounter (Signed)
Appointment was moved up to 10-27-16. See office visit note.

## 2016-10-30 ENCOUNTER — Ambulatory Visit (INDEPENDENT_AMBULATORY_CARE_PROVIDER_SITE_OTHER): Payer: 59 | Admitting: Obstetrics and Gynecology

## 2016-10-30 ENCOUNTER — Encounter: Payer: Self-pay | Admitting: Obstetrics and Gynecology

## 2016-10-30 ENCOUNTER — Ambulatory Visit: Payer: Self-pay | Admitting: Allergy

## 2016-10-30 VITALS — Ht 68.75 in | Wt 215.0 lb

## 2016-10-30 DIAGNOSIS — Z889 Allergy status to unspecified drugs, medicaments and biological substances status: Secondary | ICD-10-CM | POA: Diagnosis not present

## 2016-10-30 DIAGNOSIS — R3989 Other symptoms and signs involving the genitourinary system: Secondary | ICD-10-CM

## 2016-10-30 DIAGNOSIS — M62838 Other muscle spasm: Secondary | ICD-10-CM | POA: Diagnosis not present

## 2016-10-30 DIAGNOSIS — R768 Other specified abnormal immunological findings in serum: Secondary | ICD-10-CM | POA: Diagnosis not present

## 2016-10-30 DIAGNOSIS — M9252 Juvenile osteochondrosis of tibia and fibula, left leg: Secondary | ICD-10-CM | POA: Diagnosis not present

## 2016-10-30 DIAGNOSIS — R3915 Urgency of urination: Secondary | ICD-10-CM

## 2016-10-30 DIAGNOSIS — N83202 Unspecified ovarian cyst, left side: Secondary | ICD-10-CM

## 2016-10-30 DIAGNOSIS — R1013 Epigastric pain: Secondary | ICD-10-CM | POA: Diagnosis not present

## 2016-10-30 DIAGNOSIS — B070 Plantar wart: Secondary | ICD-10-CM | POA: Diagnosis not present

## 2016-10-30 DIAGNOSIS — R339 Retention of urine, unspecified: Secondary | ICD-10-CM

## 2016-10-30 DIAGNOSIS — A609 Anogenital herpesviral infection, unspecified: Secondary | ICD-10-CM | POA: Diagnosis not present

## 2016-10-30 DIAGNOSIS — R1030 Lower abdominal pain, unspecified: Secondary | ICD-10-CM

## 2016-10-30 DIAGNOSIS — I10 Essential (primary) hypertension: Secondary | ICD-10-CM | POA: Diagnosis not present

## 2016-10-30 DIAGNOSIS — J4550 Severe persistent asthma, uncomplicated: Secondary | ICD-10-CM | POA: Diagnosis not present

## 2016-10-30 LAB — POCT URINALYSIS DIPSTICK
Bilirubin, UA: NEGATIVE
GLUCOSE UA: NEGATIVE
KETONES UA: NEGATIVE
Nitrite, UA: NEGATIVE
Protein, UA: NEGATIVE
RBC UA: NEGATIVE
Urobilinogen, UA: NEGATIVE
pH, UA: 7

## 2016-10-30 MED ORDER — CYCLOBENZAPRINE HCL 5 MG PO TABS
5.0000 mg | ORAL_TABLET | Freq: Three times a day (TID) | ORAL | 0 refills | Status: DC | PRN
Start: 2016-10-30 — End: 2016-12-01

## 2016-10-30 NOTE — Patient Instructions (Signed)

## 2016-10-30 NOTE — Progress Notes (Signed)
Patient ID: Deborah Cordova, female   DOB: [birth year], 45 y.o.   MRN: [id number]  GYNECOLOGY  VISIT   HPI: 45 y.o.   Married  [place] American  female   [phone] with Patient's last menstrual period was 03/29/2016 (approximate).   here for follow up pelvic pain.   The patient is s/p TLH with BS in 5/17 in [place]. She has been having pain for the last 2 months. She was noted to have urinary retention on 10/30 and was taught how to self cath. In the ER on 10/29 she was noted to have a left ovarian cyst with one septation (2.2 cm). Her pain has been in her LLQ and suprapubic region.  See note from 10/30. Currently her pain in the suprapubic region is only bad at the end of catheterizing herself. Her PVR's have ranged from 50 cc to 190cc. Better yesterday. No urinary frequency, some urgency to void, no pain to void, only hurts with cath.  She was treated for a pyelonephritis on 10/05/16. On the left. She continues to have pain in her left flank (when pointing to her back, she points right under her left flank). The pain is severe and is radiating around to her LLQ and down to her buttocks. Since she has been catheterizing herself her belly pain has improved, still low level. No fevers.  The other day she had sex, some blood with intercourse. Slight discomfort with deep penetration.   GYNECOLOGIC HISTORY: Patient's last menstrual period was 03/29/2016 (approximate). Contraception: Hysterectomy Menopausal hormone therapy: None        OB History    Gravida Para Term Preterm AB Living   5 4 4  0 1 4   SAB TAB Ectopic Multiple Live Births   1 0 0 0 4         Patient Active Problem List   Diagnosis Date Noted  . Hypertension   . COPD (chronic obstructive pulmonary disease) ([place])   . Asthma     Past Medical History:  Diagnosis Date  . Asthma   . COPD (chronic obstructive pulmonary disease) ([place])   . Fibroid   . Genital warts   . Hypertension   . PID (pelvic inflammatory disease)     Past  Surgical History:  Procedure Laterality Date  . COLPOSCOPY    . DILATION AND CURETTAGE OF UTERUS    . LAPAROSCOPIC TOTAL HYSTERECTOMY    . laser genital warts      Current Outpatient Prescriptions  Medication Sig Dispense Refill  . acetaminophen (TYLENOL) 500 MG tablet Take 1,000 mg by mouth every 6 (six) hours as needed for headache.    Marland Kitchen acyclovir (ZOVIRAX) 400 MG tablet Take 400 mg by mouth as needed (For fever blisters.).    Marland Kitchen albuterol (PROAIR HFA) 108 (90 Base) MCG/ACT inhaler Inhale 2 puffs into the lungs every 4 (four) hours as needed for wheezing or shortness of breath.    Marland Kitchen amLODipine (NORVASC) 5 MG tablet Take 5 mg by mouth daily.    . betamethasone valerate ointment (VALISONE) 0.1 % Apply a pea sized amount topically BID for itching. Can use for up to 2 weeks, not for long term daily use 15 g 0  . butalbital-acetaminophen-caffeine (FIORICET, ESGIC) 50-325-40 MG tablet Take 0.5 tablets by mouth 2 (two) times daily as needed for headache.    . cetirizine (ZYRTEC ALLERGY) 10 MG tablet Take 10 mg by mouth daily.    Marland Kitchen gabapentin (NEURONTIN) 300 MG capsule Take 300 mg by  mouth daily as needed (restless leg).    Marland Kitchen HYDROcodone-acetaminophen (NORCO/VICODIN) 5-325 MG tablet Take 1-2 tablets by mouth every 6 (six) hours as needed. 24 tablet 0  . metroNIDAZOLE (FLAGYL) 500 MG tablet Take 1 tablet (500 mg total) by mouth 2 (two) times daily. 14 tablet 0  . montelukast (SINGULAIR) 10 MG tablet Take 10 mg by mouth daily.    Marland Kitchen omalizumab (XOLAIR) 150 MG injection Inject 300 mg into the skin every 14 (fourteen) days.    . ondansetron (ZOFRAN) 4 MG tablet Take 1 tablet (4 mg total) by mouth every 8 (eight) hours as needed for nausea or vomiting. 12 tablet 0  . ranitidine (ZANTAC) 150 MG tablet Take 150 mg by mouth 2 (two) times daily.    Marland Kitchen tiotropium (SPIRIVA) 18 MCG inhalation capsule Place 18 mcg into inhaler and inhale daily.     No current facility-administered medications for this visit.       ALLERGIES: Aspirin; Cantaloupe (diagnostic); Kiwi extract; Nsaids; Other; and Pineapple  Family History  Problem Relation Age of Onset  . Depression Mother   . Diabetes Mother   . Hepatitis C Mother   . Hypertension Mother   . COPD Father   . Emphysema Father   . Hepatitis C Father     Social History   Social History  . Marital status: Married    Spouse name: N/A  . Number of children: N/A  . Years of education: N/A   Occupational History  . Not on file.   Social History Main Topics  . Smoking status: Former Research scientist (life sciences)  . Smokeless tobacco: Never Used  . Alcohol use 0.6 - 1.8 oz/week    1 - 3 Glasses of wine per week  . Drug use: No  . Sexual activity: Yes    Partners: Male    Birth control/ protection: Surgical   Other Topics Concern  . Not on file   Social History Narrative  . No narrative on file    Review of Systems  Constitutional: Negative.   HENT: Negative.   Eyes: Negative.   Respiratory: Negative.   Cardiovascular: Negative.   Gastrointestinal: Negative.   Genitourinary: Positive for frequency.       Pain and bleeding with intercourse   Musculoskeletal: Negative.   Skin: Negative.   Neurological: Negative.   Endo/Heme/Allergies: Negative.   Psychiatric/Behavioral: Negative.     PHYSICAL EXAMINATION:    Ht 5' 8.75" (1.746 m)   Wt 215 lb (97.5 kg)   LMP 03/29/2016 (Approximate)   BMI 31.98 kg/m     General appearance: alert, cooperative and appears stated age Abdomen: soft, minimally tender in the suprapubic and LLQ regions;  no masses,  no organomegaly Tender in her left back just under her left flank  Pelvic: External genitalia:  no lesions              Urethra:  normal appearing urethra with no masses, tenderness or lesions              Bartholins and Skenes: normal                 Vagina: normal appearing vagina with normal color and discharge, no lesions              Cervix: absent and cuff looks well healed, tender to palpation               Bimanual Exam:  Uterus:  uterus absent  Adnexa: no mass, tender on the left              Bladder: tender to palation  Chaperone was present for exam.  ASSESSMENT Urinary retention, improving Bladder very tender, urine dip with trace leuk otherwise negative Abdominal pain, improving Muscle spasm, left back Left ovarian cyst Some bladder spasms and bladder tenderness, urine culture not sent the other day Post coital spotting, no vaginal lesions, no granulation tissue seen.      PLAN Can't take NSAID's Flexeril for muscle spasm, tylenol as needed, she does have some narcotics left from the ER Discussed heat, soaking in a warm tub, therapeutic massage Send urine for ua, c&s Avoid intercourse until she is feeling better F/U ultrasound in 2 months She will cath herself a few more times today, as long as her PVR is less than 150, she will stop F/u next week, sooner with issues   An After Visit Summary was printed and given to the patient.

## 2016-10-31 LAB — URINALYSIS, MICROSCOPIC ONLY
Bacteria, UA: NONE SEEN [HPF]
CASTS: NONE SEEN [LPF]
CRYSTALS: NONE SEEN [HPF]
RBC / HPF: NONE SEEN RBC/HPF (ref <=2)
Squamous Epithelial / LPF: NONE SEEN [HPF] (ref <=5)
WBC, UA: NONE SEEN WBC/HPF (ref <=5)
YEAST: NONE SEEN [HPF]

## 2016-11-01 LAB — URINE CULTURE: ORGANISM ID, BACTERIA: NO GROWTH

## 2016-11-08 ENCOUNTER — Encounter (HOSPITAL_COMMUNITY): Payer: Self-pay | Admitting: Emergency Medicine

## 2016-11-08 ENCOUNTER — Emergency Department (HOSPITAL_COMMUNITY)
Admission: EM | Admit: 2016-11-08 | Discharge: 2016-11-08 | Disposition: A | Discharge status: Home / Self Care | Payer: 59 | Attending: Emergency Medicine | Admitting: Emergency Medicine

## 2016-11-08 DIAGNOSIS — Z79899 Other long term (current) drug therapy: Secondary | ICD-10-CM | POA: Diagnosis not present

## 2016-11-08 DIAGNOSIS — I10 Essential (primary) hypertension: Secondary | ICD-10-CM | POA: Diagnosis not present

## 2016-11-08 DIAGNOSIS — N3001 Acute cystitis with hematuria: Secondary | ICD-10-CM | POA: Diagnosis not present

## 2016-11-08 DIAGNOSIS — J45909 Unspecified asthma, uncomplicated: Secondary | ICD-10-CM | POA: Diagnosis not present

## 2016-11-08 DIAGNOSIS — Z87891 Personal history of nicotine dependence: Secondary | ICD-10-CM | POA: Diagnosis not present

## 2016-11-08 DIAGNOSIS — R319 Hematuria, unspecified: Secondary | ICD-10-CM

## 2016-11-08 HISTORY — DX: Disorder of kidney and ureter, unspecified: N28.9

## 2016-11-08 LAB — URINALYSIS, ROUTINE W REFLEX MICROSCOPIC
BILIRUBIN URINE: NEGATIVE
Glucose, UA: NEGATIVE mg/dL
KETONES UR: NEGATIVE mg/dL
NITRITE: NEGATIVE
PH: 6.5 (ref 5.0–8.0)
Specific Gravity, Urine: 1.017 (ref 1.005–1.030)

## 2016-11-08 LAB — URINE MICROSCOPIC-ADD ON

## 2016-11-08 LAB — PREGNANCY, URINE: PREG TEST UR: NEGATIVE

## 2016-11-08 MED ORDER — SULFAMETHOXAZOLE-TRIMETHOPRIM 800-160 MG PO TABS
1.0000 | ORAL_TABLET | Freq: Once | ORAL | Status: AC
  Administered 2016-11-08: 1 via ORAL
  Filled 2016-11-08: qty 1

## 2016-11-08 MED ORDER — ONDANSETRON 4 MG PO TBDP
4.0000 mg | ORAL_TABLET | Freq: Once | ORAL | Status: AC
  Administered 2016-11-08: 4 mg via ORAL
  Filled 2016-11-08: qty 1

## 2016-11-08 MED ORDER — SULFAMETHOXAZOLE-TRIMETHOPRIM 800-160 MG PO TABS: 1.0000 | ORAL_TABLET | Freq: Two times a day (BID) | ORAL | 0 refills | Status: DC

## 2016-11-08 MED ORDER — HYDROCODONE-ACETAMINOPHEN 5-325 MG PO TABS
1.0000 | ORAL_TABLET | Freq: Once | ORAL | Status: AC
  Administered 2016-11-08: 1 via ORAL
  Filled 2016-11-08: qty 1

## 2016-11-08 MED ORDER — PHENAZOPYRIDINE HCL 100 MG PO TABS
100.0000 mg | ORAL_TABLET | Freq: Once | ORAL | Status: AC
  Administered 2016-11-08: 100 mg via ORAL
  Filled 2016-11-08: qty 1

## 2016-11-08 MED ORDER — PHENAZOPYRIDINE HCL 200 MG PO TABS: 200.0000 mg | ORAL_TABLET | Freq: Three times a day (TID) | ORAL | 0 refills | Status: DC

## 2016-11-08 NOTE — ED Triage Notes (Signed)
Per pt, states she woke up this am with pelvic pressure-went to bathroom and had clots, blood I urine-states has been treated in past

## 2016-11-08 NOTE — ED Notes (Signed)
VS not collected with triage. Pt in restroom post triage. VS collected when pt returned to room.

## 2016-11-08 NOTE — ED Provider Notes (Signed)
[facility] DEPT Provider Note   CSN: [id number] Arrival date & time: 11/08/16  [id number]     History   Chief Complaint Chief Complaint  Patient presents with  . Hematuria    HPI Deborah Cordova is a 45 y.o. female. He presents for evaluation of burning with urination, urinary frequency, and bloody urine. Symptoms started earlier this morning. The bathroom all morning. Is now noticed blood in her urine. Has some low back pain. No flank pain. No fevers chills nausea vomiting. Last was last normal. No vaginal discharge or bleeding. Previous UTIs.  HPI  Past Medical History:  Diagnosis Date  . Asthma   . COPD (chronic obstructive pulmonary disease) ([place])   . Fibroid   . Genital warts   . Hypertension   . PID (pelvic inflammatory disease)   . Renal disorder     Patient Active Problem List   Diagnosis Date Noted  . Hypertension   . COPD (chronic obstructive pulmonary disease) ([place])   . Asthma     Past Surgical History:  Procedure Laterality Date  . COLPOSCOPY    . DILATION AND CURETTAGE OF UTERUS    . LAPAROSCOPIC TOTAL HYSTERECTOMY    . laser genital warts      OB History    Gravida Para Term Preterm AB Living   5 4 4  0 1 4   SAB TAB Ectopic Multiple Live Births   1 0 0 0 4       Home Medications    Prior to Admission medications   Medication Sig Start Date End Date Taking? Authorizing Provider  acetaminophen (TYLENOL) 500 MG tablet Take 1,000 mg by mouth every 6 (six) hours as needed for headache.   Yes Historical Provider, MD  acyclovir (ZOVIRAX) 400 MG tablet Take 400 mg by mouth as needed (For fever blisters.).   Yes Historical Provider, MD  albuterol (PROAIR HFA) 108 (90 Base) MCG/ACT inhaler Inhale 2 puffs into the lungs every 4 (four) hours as needed for wheezing or shortness of breath.   Yes Historical Provider, MD  amLODipine (NORVASC) 5 MG tablet Take 5 mg by mouth daily.   Yes Historical Provider, MD  betamethasone valerate ointment (VALISONE) 0.1 %  Apply a pea sized amount topically BID for itching. Can use for up to 2 weeks, not for long term daily use 10/27/16  Yes Salvadore Dom, MD  butalbital-acetaminophen-caffeine (FIORICET, ESGIC) [phone] MG tablet Take 0.5 tablets by mouth 2 (two) times daily as needed for headache.   Yes Historical Provider, MD  cetirizine (ZYRTEC ALLERGY) 10 MG tablet Take 10 mg by mouth daily.   Yes Historical Provider, MD  cyclobenzaprine (FLEXERIL) 5 MG tablet Take 1 tablet (5 mg total) by mouth 3 (three) times daily as needed for muscle spasms. 10/30/16  Yes Salvadore Dom, MD  gabapentin (NEURONTIN) 300 MG capsule Take 300 mg by mouth daily as needed (restless leg).   Yes Historical Provider, MD  montelukast (SINGULAIR) 10 MG tablet Take 10 mg by mouth daily.   Yes Historical Provider, MD  omalizumab Arvid Right) 150 MG injection Inject 300 mg into the skin every 14 (fourteen) days.   Yes Historical Provider, MD  ondansetron (ZOFRAN) 4 MG tablet Take 1 tablet (4 mg total) by mouth every 8 (eight) hours as needed for nausea or vomiting. 10/05/16  Yes Domenic Moras, PA-C  ranitidine (ZANTAC) 150 MG tablet Take 150 mg by mouth 2 (two) times daily.   Yes Historical Provider, MD  tiotropium [facility])  18 MCG inhalation capsule Place 18 mcg into inhaler and inhale daily.   Yes Historical Provider, MD  HYDROcodone-acetaminophen (NORCO/VICODIN) 5-325 MG tablet Take 1-2 tablets by mouth every 6 (six) hours as needed. Patient not taking: Reported on 11/08/2016 10/27/16   Kalman Drape, PA  metroNIDAZOLE (FLAGYL) 500 MG tablet Take 1 tablet (500 mg total) by mouth 2 (two) times daily. Patient not taking: Reported on 11/08/2016 10/27/16   Salvadore Dom, MD  phenazopyridine (PYRIDIUM) 200 MG tablet Take 1 tablet (200 mg total) by mouth 3 (three) times daily. 11/08/16   Tanna Furry, MD  sulfamethoxazole-trimethoprim (BACTRIM DS,SEPTRA DS) 800-160 MG tablet Take 1 tablet by mouth 2 (two) times daily. 11/08/16   Tanna Furry, MD    Family History Family History  Problem Relation Age of Onset  . Depression Mother   . Diabetes Mother   . Hepatitis C Mother   . Hypertension Mother   . COPD Father   . Emphysema Father   . Hepatitis C Father     Social History Social History  Substance Use Topics  . Smoking status: Former Research scientist (life sciences)  . Smokeless tobacco: Never Used  . Alcohol use 0.6 - 1.8 oz/week    1 - 3 Glasses of wine per week     Allergies   Aspirin; Cantaloupe (diagnostic); Kiwi extract; Nsaids; Other; and Pineapple   Review of Systems Review of Systems  Constitutional: Negative for appetite change, chills, diaphoresis, fatigue and fever.  HENT: Negative for mouth sores, sore throat and trouble swallowing.   Eyes: Negative for visual disturbance.  Respiratory: Negative for cough, chest tightness, shortness of breath and wheezing.   Cardiovascular: Negative for chest pain.  Gastrointestinal: Negative for abdominal distention, abdominal pain, diarrhea, nausea and vomiting.  Endocrine: Negative for polydipsia, polyphagia and polyuria.  Genitourinary: Positive for frequency, hematuria and urgency. Negative for dysuria.  Musculoskeletal: Negative for gait problem.  Skin: Negative for color change, pallor and rash.  Neurological: Negative for dizziness, syncope, light-headedness and headaches.  Hematological: Does not bruise/bleed easily.  Psychiatric/Behavioral: Negative for behavioral problems and confusion.     Physical Exam Updated Vital Signs BP 122/82 (BP Location: Right Arm)   Pulse 89   Temp 98.2 F (36.8 C) (Oral)   Resp 18   LMP 03/29/2016 (Approximate)   SpO2 97%   Physical Exam  Constitutional: She is oriented to person, place, and time. She appears well-developed and well-nourished. No distress.  HENT:  Head: Normocephalic.  Eyes: Conjunctivae are normal. Pupils are equal, round, and reactive to light. No scleral icterus.  Neck: Normal range of motion. Neck supple.  No thyromegaly present.  Cardiovascular: Normal rate and regular rhythm.  Exam reveals no gallop and no friction rub.   No murmur heard. Pulmonary/Chest: Effort normal and breath sounds normal. No respiratory distress. She has no wheezes. She has no rales.  Abdominal: Soft. Bowel sounds are normal. She exhibits no distension. There is no tenderness. There is no rebound.  Some suprapubic tenderness. No frank guarding or rebound. No flank pain or tenderness.  Musculoskeletal: Normal range of motion.  Neurological: She is alert and oriented to person, place, and time.  Skin: Skin is warm and dry. No rash noted.  Psychiatric: She has a normal mood and affect. Her behavior is normal.     ED Treatments / Results  Labs (all labs ordered are listed, but only abnormal results are displayed) Labs Reviewed  URINALYSIS, [facility] Negron Torres (NOT AT [facility]) -  Abnormal; Notable for the following:       Result Value   APPearance CLOUDY (*)    Hgb urine dipstick LARGE (*)    Protein, ur >300 (*)    Leukocytes, UA TRACE (*)    All other components within normal limits  URINE MICROSCOPIC-ADD ON - Abnormal; Notable for the following:    Squamous Epithelial / LPF 0-5 (*)    Bacteria, UA FEW (*)    All other components within normal limits  PREGNANCY, URINE    EKG  EKG Interpretation None       Radiology No results found.  Procedures Procedures (including critical care time)  Medications Ordered in ED Medications  sulfamethoxazole-trimethoprim (BACTRIM DS,SEPTRA DS) 800-160 MG per tablet 1 tablet (not administered)  phenazopyridine (PYRIDIUM) tablet 100 mg (100 mg Oral Given 11/08/16 0820)  ondansetron (ZOFRAN-ODT) disintegrating tablet 4 mg (4 mg Oral Given 11/08/16 0820)  HYDROcodone-acetaminophen (NORCO/VICODIN) 5-325 MG per tablet 1 tablet (1 tablet Oral Given 11/08/16 0820)     Initial Impression / Assessment and Plan / ED Course  I have reviewed the triage vital signs and  the nursing notes.  Pertinent labs & imaging results that were available during my care of the patient were reviewed by me and considered in my medical decision making (see chart for details).  Clinical Course     Urine appears infected. Given Pyridium and Bactrim here. Discharged home.  Final Clinical Impressions(s) / ED Diagnoses   Final diagnoses:  Hematuria, unspecified type  Acute cystitis with hematuria    New Prescriptions New Prescriptions   PHENAZOPYRIDINE (PYRIDIUM) 200 MG TABLET    Take 1 tablet (200 mg total) by mouth 3 (three) times daily.   SULFAMETHOXAZOLE-TRIMETHOPRIM (BACTRIM DS,SEPTRA DS) 800-160 MG TABLET    Take 1 tablet by mouth 2 (two) times daily.     Tanna Furry, MD 11/08/16 [phone]

## 2016-11-08 NOTE — Discharge Instructions (Signed)
Take Bactrim as directed until completed.  Pyridium can make your urine orange. It will stain clothing and contact lenses.  Push fluids, stay hydrated.  Return to ER as needed for fevers, flank pain, other symptoms.

## 2016-11-08 NOTE — ED Notes (Signed)
Jeneen Rinks MD at bedside.

## 2016-11-13 ENCOUNTER — Encounter: Payer: Self-pay | Admitting: Obstetrics and Gynecology

## 2016-11-13 ENCOUNTER — Ambulatory Visit (INDEPENDENT_AMBULATORY_CARE_PROVIDER_SITE_OTHER): Payer: 59 | Admitting: Obstetrics and Gynecology

## 2016-11-13 VITALS — BP 110/72 | HR 76 | Temp 98.8°F | Ht 68.75 in | Wt 209.0 lb

## 2016-11-13 DIAGNOSIS — Z87448 Personal history of other diseases of urinary system: Secondary | ICD-10-CM

## 2016-11-13 DIAGNOSIS — D2272 Melanocytic nevi of left lower limb, including hip: Secondary | ICD-10-CM | POA: Diagnosis not present

## 2016-11-13 DIAGNOSIS — Z8742 Personal history of other diseases of the female genital tract: Secondary | ICD-10-CM

## 2016-11-13 DIAGNOSIS — R10A0 Flank pain, unspecified side: Secondary | ICD-10-CM

## 2016-11-13 DIAGNOSIS — D485 Neoplasm of uncertain behavior of skin: Secondary | ICD-10-CM | POA: Diagnosis not present

## 2016-11-13 DIAGNOSIS — Z87898 Personal history of other specified conditions: Secondary | ICD-10-CM | POA: Diagnosis not present

## 2016-11-13 DIAGNOSIS — B078 Other viral warts: Secondary | ICD-10-CM | POA: Diagnosis not present

## 2016-11-13 DIAGNOSIS — N398 Other specified disorders of urinary system: Secondary | ICD-10-CM

## 2016-11-13 DIAGNOSIS — M25561 Pain in right knee: Secondary | ICD-10-CM | POA: Diagnosis not present

## 2016-11-13 DIAGNOSIS — R109 Unspecified abdominal pain: Secondary | ICD-10-CM | POA: Diagnosis not present

## 2016-11-13 LAB — POCT URINALYSIS DIPSTICK
BILIRUBIN UA: NEGATIVE
GLUCOSE UA: NEGATIVE
Ketones, UA: NEGATIVE
Nitrite, UA: NEGATIVE
Protein, UA: NEGATIVE
RBC UA: NEGATIVE
Urobilinogen, UA: NEGATIVE
pH, UA: 5

## 2016-11-13 NOTE — Progress Notes (Signed)
Patient ID: Deborah Cordova, female   DOB: [birth year], 45 y.o.   MRN: [id number] GYNECOLOGY  VISIT   HPI: 45 y.o.   Married  [place] American  female   [phone] with Patient's last menstrual period was 03/29/2016 (approximate).   here for hematuria. Patient states seen in E.R.on 11-08-16 and treated with Bactrim for UTI. No culture was sent, she did have trace leuk and large blood on urin dip. She was having frequency of urination and gross hematuria at the time of her evaluation in the ER. No dysuria. The hematuria stopped while she was on antibiotics.  The patient is s/p TLH/BS in 5/17 in [place]. She was having urinary retention post operatively that was not realized until a few weeks ago. She was taught to self cath. She self catheterized for 4 days, then her retention improved (all under 150). Still needs to valsalva to void.  She was noted to have a left ovarian cyst on 10/29 and has had some pain in the LLQ, not as bad as it was. Now mainly just tender (2.2 cm complex). She has a f/u ultrasound scheduled.  On 10/05/16 she was treated for a pyelonephritis, was voiding blood. This was on the left. She had a negative CT for stones at that time.  Currently still having urinary frequency, nocturia 2-3 x a night (new in the last 6 weeks). Voiding normal amounts. No urgency, no dysuria. When her bladder is full she has discomfort in her left flank. No longer hurts in her abdomen unless she pushes on it. Flank only hurts when her bladder is full, dull ache. Tolerable. Overall she feels so much better than she did when she was first here a few weeks ago. She was constipated a few days ago, now with normal BM's. She states her libido has been low for a while. Lately hasn't wanted to have sex secondary to pain.    Urine QD:[id number] WBCs Temp:98.8  GYNECOLOGIC HISTORY: Patient's last menstrual period was 03/29/2016 (approximate). Contraception:Hysterectomy Menopausal hormone therapy: none        OB  History    Gravida Para Term Preterm AB Living   5 4 4  0 1 4   SAB TAB Ectopic Multiple Live Births   1 0 0 0 4         Patient Active Problem List   Diagnosis Date Noted  . Hypertension   . COPD (chronic obstructive pulmonary disease) ([place])   . Asthma     Past Medical History:  Diagnosis Date  . Asthma   . COPD (chronic obstructive pulmonary disease) ([place])   . Fibroid   . Genital warts   . Hypertension   . PID (pelvic inflammatory disease)   . Renal disorder     Past Surgical History:  Procedure Laterality Date  . COLPOSCOPY    . DILATION AND CURETTAGE OF UTERUS    . LAPAROSCOPIC TOTAL HYSTERECTOMY    . laser genital warts      Current Outpatient Prescriptions  Medication Sig Dispense Refill  . acetaminophen (TYLENOL) 500 MG tablet Take 1,000 mg by mouth every 6 (six) hours as needed for headache.    Marland Kitchen acyclovir (ZOVIRAX) 400 MG tablet Take 400 mg by mouth as needed (For fever blisters.).    Marland Kitchen albuterol (PROAIR HFA) 108 (90 Base) MCG/ACT inhaler Inhale 2 puffs into the lungs every 4 (four) hours as needed for wheezing or shortness of breath.    Marland Kitchen amLODipine (NORVASC) 5 MG tablet Take 5 mg  by mouth daily.    . betamethasone valerate ointment (VALISONE) 0.1 % Apply a pea sized amount topically BID for itching. Can use for up to 2 weeks, not for long term daily use 15 g 0  . butalbital-acetaminophen-caffeine (FIORICET, ESGIC) 50-325-40 MG tablet Take 0.5 tablets by mouth 2 (two) times daily as needed for headache.    . cetirizine (ZYRTEC ALLERGY) 10 MG tablet Take 10 mg by mouth daily.    . cyclobenzaprine (FLEXERIL) 5 MG tablet Take 1 tablet (5 mg total) by mouth 3 (three) times daily as needed for muscle spasms. 20 tablet 0  . gabapentin (NEURONTIN) 300 MG capsule Take 300 mg by mouth daily as needed (restless leg).    Marland Kitchen HYDROcodone-acetaminophen (NORCO/VICODIN) 5-325 MG tablet Take 1-2 tablets by mouth every 6 (six) hours as needed. 24 tablet 0  . metroNIDAZOLE (FLAGYL)  500 MG tablet Take 1 tablet (500 mg total) by mouth 2 (two) times daily. 14 tablet 0  . montelukast (SINGULAIR) 10 MG tablet Take 10 mg by mouth daily.    Marland Kitchen omalizumab (XOLAIR) 150 MG injection Inject 300 mg into the skin every 14 (fourteen) days.    . ondansetron (ZOFRAN) 4 MG tablet Take 1 tablet (4 mg total) by mouth every 8 (eight) hours as needed for nausea or vomiting. 12 tablet 0  . ranitidine (ZANTAC) 150 MG tablet Take 150 mg by mouth 2 (two) times daily.    Marland Kitchen sulfamethoxazole-trimethoprim (BACTRIM DS,SEPTRA DS) 800-160 MG tablet Take 1 tablet by mouth 2 (two) times daily. 14 tablet 0  . tiotropium (SPIRIVA) 18 MCG inhalation capsule Place 18 mcg into inhaler and inhale daily.     No current facility-administered medications for this visit.      ALLERGIES: Aspirin; Cantaloupe (diagnostic); Kiwi extract; Nsaids; Other; Pineapple; and Pyridium [phenazopyridine hcl]  Family History  Problem Relation Age of Onset  . Depression Mother   . Diabetes Mother   . Hepatitis C Mother   . Hypertension Mother   . COPD Father   . Emphysema Father   . Hepatitis C Father     Social History   Social History  . Marital status: Married    Spouse name: N/A  . Number of children: N/A  . Years of education: N/A   Occupational History  . Not on file.   Social History Main Topics  . Smoking status: Former Research scientist (life sciences)  . Smokeless tobacco: Never Used  . Alcohol use 0.6 - 1.8 oz/week    1 - 3 Glasses of wine per week  . Drug use: No  . Sexual activity: Yes    Partners: Male    Birth control/ protection: Surgical   Other Topics Concern  . Not on file   Social History Narrative  . No narrative on file    Review of Systems  Constitutional: Negative.   HENT: Negative.   Eyes: Negative.   Respiratory: Negative.   Cardiovascular: Negative.   Gastrointestinal: Negative.   Genitourinary: Positive for flank pain, frequency and hematuria.  Skin: Negative.   Neurological: Negative.    Endo/Heme/Allergies: Negative.   Psychiatric/Behavioral: Negative.     PHYSICAL EXAMINATION:    BP 110/72 (BP Location: Right Arm, Patient Position: Sitting, Cuff Size: Large)   Pulse 76   Temp 98.8 F (37.1 C) (Oral)   Ht 5' 8.75" (1.746 m)   Wt 209 lb (94.8 kg)   LMP 03/29/2016 (Approximate)   BMI 31.09 kg/m     General appearance: alert, cooperative  and appears stated age Abdomen: soft, minimally tender in the LLQ, no rebound, no guarding; no masses,  no organomegaly  Pelvic: External genitalia:  no lesions              Urethra:  normal appearing urethra with no masses, tenderness or lesions              Bimanual Exam:  No masses, cuff intact, minimally tender in the left adnexal region and left side of her vagina cuff.   Bladder: not tender  Chaperone was present for exam.  ASSESSMENT H/O Laparoscopic hysterectomy (out of town) H/o urinary retention after surgery, just diagnosed a few weeks ago, stopped self catheterizing when her PVR's were consisently less than 150 cc Voiding dysfunction, still has to strain to void H/O left ovarian cyst H/O pyelonephritis las month, UTI this month Flank pain with a full bladder    PLAN Exam has improved  Currently on antibiotics for a UTI and hematuria has resolved Will refer to urology for voiding dysfunction and flank pain (negatvie CT) F/U for a pelvic ultrasound next month Call with recurrent UTI symptoms   An After Visit Summary was printed and given to the patient.  20 minutes face to face time of which over 50% was spent in counseling.

## 2016-11-14 ENCOUNTER — Telehealth: Payer: Self-pay | Admitting: *Deleted

## 2016-11-14 ENCOUNTER — Encounter: Payer: Self-pay | Admitting: Obstetrics and Gynecology

## 2016-11-14 NOTE — Telephone Encounter (Signed)
Opened in error, will close encounter

## 2016-11-25 ENCOUNTER — Encounter: Payer: Self-pay | Admitting: Family Medicine

## 2016-11-25 ENCOUNTER — Ambulatory Visit (INDEPENDENT_AMBULATORY_CARE_PROVIDER_SITE_OTHER): Payer: 59 | Admitting: Family Medicine

## 2016-11-25 VITALS — BP 124/80 | HR 90 | Temp 98.3°F | Resp 18 | Wt 214.0 lb

## 2016-11-25 DIAGNOSIS — R05 Cough: Secondary | ICD-10-CM | POA: Diagnosis not present

## 2016-11-25 DIAGNOSIS — R062 Wheezing: Secondary | ICD-10-CM

## 2016-11-25 DIAGNOSIS — J069 Acute upper respiratory infection, unspecified: Secondary | ICD-10-CM | POA: Diagnosis not present

## 2016-11-25 DIAGNOSIS — J410 Simple chronic bronchitis: Secondary | ICD-10-CM | POA: Diagnosis not present

## 2016-11-25 DIAGNOSIS — J4541 Moderate persistent asthma with (acute) exacerbation: Secondary | ICD-10-CM

## 2016-11-25 DIAGNOSIS — R0982 Postnasal drip: Secondary | ICD-10-CM

## 2016-11-25 DIAGNOSIS — R059 Cough, unspecified: Secondary | ICD-10-CM

## 2016-11-25 LAB — POCT INFLUENZA A/B
Influenza A, POC: NEGATIVE
Influenza B, POC: NEGATIVE

## 2016-11-25 MED ORDER — AZITHROMYCIN 250 MG PO TABS: ORAL_TABLET | ORAL | 0 refills | Status: DC

## 2016-11-25 MED ORDER — FLUCONAZOLE 150 MG PO TABS
150.0000 mg | ORAL_TABLET | Freq: Once | ORAL | 0 refills | Status: AC
Start: 2016-11-25 — End: 2016-11-25

## 2016-11-25 MED ORDER — METHYLPREDNISOLONE SODIUM SUCC 125 MG IJ SOLR
125.0000 mg | Freq: Once | INTRAMUSCULAR | Status: AC
  Administered 2016-11-25: 125 mg via INTRAMUSCULAR

## 2016-11-25 MED ORDER — PREDNISONE 20 MG PO TABS
60.0000 mg | ORAL_TABLET | Freq: Every day | ORAL | 0 refills | Status: AC
Start: 2016-11-25 — End: 2016-11-30

## 2016-11-25 NOTE — Progress Notes (Addendum)
Chief Complaint  Patient presents with  . URI    x 4days    HPI  Acute URI Pt reports that she started having respiratory symptoms with cough, postnasal drip, increase work of breathing and shortness of breath.  She has been using her inhaler 4 times a day since her symptoms started 4 days ago.  She denies fevers or chills.  Reports that her cough is becoming more productive.   Asthma Exacerbation Her asthma is typically triggered by URI She reports that prior to this her asthma was well controlled and she only needed one dose of her rescue inhaler once a day a few times a week She reports a history of a complex asthma that progressed to COPD She was was intubated for respiratory distress due to asthma in 1996.  She has not had any ER visits in the past month.  She is a former smoker. She states that she has been compliant with her medications.   Past Medical History:  Diagnosis Date  . Asthma   . COPD (chronic obstructive pulmonary disease) ([place])   . Fibroid   . Genital warts   . Hypertension   . PID (pelvic inflammatory disease)   . Renal disorder     Current Outpatient Prescriptions  Medication Sig Dispense Refill  . acetaminophen (TYLENOL) 500 MG tablet Take 1,000 mg by mouth every 6 (six) hours as needed for headache.    Marland Kitchen acyclovir (ZOVIRAX) 400 MG tablet Take 400 mg by mouth as needed (For fever blisters.).    Marland Kitchen albuterol (PROAIR HFA) 108 (90 Base) MCG/ACT inhaler Inhale 2 puffs into the lungs every 4 (four) hours as needed for wheezing or shortness of breath.    Marland Kitchen amLODipine (NORVASC) 5 MG tablet Take 5 mg by mouth daily.    . butalbital-acetaminophen-caffeine (FIORICET, ESGIC) 50-325-40 MG tablet Take 0.5 tablets by mouth 2 (two) times daily as needed for headache.    . cetirizine (ZYRTEC ALLERGY) 10 MG tablet Take 10 mg by mouth daily.    Marland Kitchen gabapentin (NEURONTIN) 300 MG capsule Take 300 mg by mouth daily as needed (restless leg).    . montelukast (SINGULAIR) 10 MG  tablet Take 10 mg by mouth daily.    Marland Kitchen omalizumab (XOLAIR) 150 MG injection Inject 300 mg into the skin every 14 (fourteen) days.    . ranitidine (ZANTAC) 150 MG tablet Take 150 mg by mouth 2 (two) times daily.    Marland Kitchen tiotropium (SPIRIVA) 18 MCG inhalation capsule Place 18 mcg into inhaler and inhale daily.    Marland Kitchen azithromycin (ZITHROMAX) 250 MG tablet Take 2 tablets by mouth on day 1 then one tablet after 6 tablet 0  . betamethasone valerate ointment (VALISONE) 0.1 % Apply a pea sized amount topically BID for itching. Can use for up to 2 weeks, not for long term daily use (Patient not taking: Reported on 11/25/2016) 15 g 0  . cyclobenzaprine (FLEXERIL) 5 MG tablet Take 1 tablet (5 mg total) by mouth 3 (three) times daily as needed for muscle spasms. (Patient not taking: Reported on 11/25/2016) 20 tablet 0  . fluconazole (DIFLUCAN) 150 MG tablet Take 1 tablet (150 mg total) by mouth once. Take second dose in 3 days 2 tablet 0  . HYDROcodone-acetaminophen (NORCO/VICODIN) 5-325 MG tablet Take 1-2 tablets by mouth every 6 (six) hours as needed. (Patient not taking: Reported on 11/25/2016) 24 tablet 0  . metroNIDAZOLE (FLAGYL) 500 MG tablet Take 1 tablet (500 mg total) by mouth 2 (  two) times daily. (Patient not taking: Reported on 11/25/2016) 14 tablet 0  . ondansetron (ZOFRAN) 4 MG tablet Take 1 tablet (4 mg total) by mouth every 8 (eight) hours as needed for nausea or vomiting. (Patient not taking: Reported on 11/25/2016) 12 tablet 0  . predniSONE (DELTASONE) 20 MG tablet Take 3 tablets (60 mg total) by mouth daily with breakfast. 15 tablet 0  . sulfamethoxazole-trimethoprim (BACTRIM DS,SEPTRA DS) 800-160 MG tablet Take 1 tablet by mouth 2 (two) times daily. (Patient not taking: Reported on 11/25/2016) 14 tablet 0   No current facility-administered medications for this visit.     Allergies:  Allergies  Allergen Reactions  . Aspirin Other (See Comments)    Asthma flare up - wheezing  . Cantaloupe  (Diagnostic) Itching and Other (See Comments)    Makes tongue itch  . Kiwi Extract Itching and Other (See Comments)    Makes tongue itch  . Nsaids Other (See Comments)    Asthma flare up - wheezing   . Other Other (See Comments)    Pecans and walnuts---induce asthma attack   . Pineapple Other (See Comments)    Tongue bleeds  . Pyridium [Phenazopyridine Hcl] Itching    Itching, swelling and burning    Past Surgical History:  Procedure Laterality Date  . COLPOSCOPY    . DILATION AND CURETTAGE OF UTERUS    . LAPAROSCOPIC TOTAL HYSTERECTOMY    . laser genital warts      Social History   Social History  . Marital status: Married    Spouse name: N/A  . Number of children: N/A  . Years of education: N/A   Social History Main Topics  . Smoking status: Former Research scientist (life sciences)  . Smokeless tobacco: Never Used  . Alcohol use 0.6 - 1.8 oz/week    1 - 3 Glasses of wine per week  . Drug use: No  . Sexual activity: Yes    Partners: Male    Birth control/ protection: Surgical   Other Topics Concern  . Not on file   Social History Narrative  . No narrative on file    Review of Systems  Constitutional: Negative for chills and fever.  HENT: Positive for congestion, ear pain, sinus pain and sore throat. Negative for ear discharge and nosebleeds.   Respiratory: Negative for hemoptysis.        See hpi  Cardiovascular: Positive for palpitations. Negative for chest pain, orthopnea, leg swelling and PND.       Palpitations after using albuterol  Gastrointestinal: Negative for nausea and vomiting.  Musculoskeletal: Negative for back pain and neck pain.  Neurological: Negative for dizziness, tingling and tremors.    Objective: Vitals:   11/25/16 1849  BP: 124/80  Pulse: 90  Resp: 18  Temp: 98.3 F (36.8 C)  TempSrc: Oral  SpO2: 98%  Weight: 214 lb (97.1 kg)    Physical Exam  General: alert, oriented, in NAD Head: normocephalic, atraumatic, no sinus tenderness Eyes: EOM intact,  no scleral icterus or conjunctival injection Ears: TM clear on the left, on the right TM with erythema Throat: no pharyngeal exudate or erythema Lymph: no posterior auricular, submental or cervical lymph adenopathy Heart: normal rate, normal sinus rhythm, no murmurs Lungs: clear to auscultation bilaterally, + wheezing in the right middle lobe and the right lung base    Assessment and Plan Sritha was seen today for uri.  Diagnoses and all orders for this visit:  Simple chronic bronchitis ([place]) Wheezing-  Wheezing due  to asthmatic bronchospasm Wheezing improved after patient took albuterol with spacer  Cough- cough due to postnasal drip and acute asthmatic bronchospasm Advised pt not to take a cough suppressant as her asthma is causing cough  Postnasal drip- continue zyrtec  Acute Asthma Exacerbation -   Pt currently having an acute asthma attack -   Will give 5 days of high dose steroid after solumedrol in clinic -   Will treat for COPD/Asthma exacerbation with zpak -   Due to patient being high risk given her previous intubation will do close follow up in 24 hours -  Pt will follow up for reassessment in clinic tomorrow  Acute upper respiratory infection- negative for flu -     POCT Influenza A/B  Other orders -     predniSONE (DELTASONE) 20 MG tablet; Take 3 tablets (60 mg total) by mouth daily with breakfast. -     azithromycin (ZITHROMAX) 250 MG tablet; Take 2 tablets by mouth on day 1 then one tablet after -     fluconazole (DIFLUCAN) 150 MG tablet; Take 1 tablet (150 mg total) by mouth once. Take second dose in 3 days    [place]

## 2016-11-25 NOTE — Patient Instructions (Addendum)
IF you received an x-ray today, you will receive an invoice from [facility]. Please contact [facility] at [phone] with questions or concerns regarding your invoice.   IF you received labwork today, you will receive an invoice from [employer]. Please contact [facility] at [phone] with questions or concerns regarding your invoice.   Our billing staff will not be able to assist you with questions regarding bills from these companies.  You will be contacted with the lab results as soon as they are available. The fastest way to get your results is to activate your My Chart account. Instructions are located on the last page of this paperwork. If you have not heard from [place] regarding the results in 2 weeks, please contact this office.      Asthma, Acute Bronchospasm Acute bronchospasm caused by asthma is also referred to as an asthma attack. Bronchospasm means your air passages become narrowed. The narrowing is caused by inflammation and tightening of the muscles in the air tubes (bronchi) in your lungs. This can make it hard to breathe or cause you to wheeze and cough. What are the causes? Possible triggers are:  Animal dander from the skin, hair, or feathers of animals.  Dust mites contained in house dust.  Cockroaches.  Pollen from trees or grass.  Mold.  Cigarette or tobacco smoke.  Air pollutants such as dust, household cleaners, hair sprays, aerosol sprays, paint fumes, strong chemicals, or strong odors.  Cold air or weather changes. Cold air may trigger inflammation. Winds increase molds and pollens in the air.  Strong emotions such as crying or laughing hard.  Stress.  Certain medicines such as aspirin or beta-blockers.  Sulfites in foods and drinks, such as dried fruits and wine.  Infections or inflammatory conditions, such as a flu, cold, or inflammation of the nasal membranes (rhinitis).  Gastroesophageal  reflux disease (GERD). GERD is a condition where stomach acid backs up into your esophagus.  Exercise or strenuous activity. What are the signs or symptoms?  Wheezing.  Excessive coughing, particularly at night.  Chest tightness.  Shortness of breath. How is this diagnosed? Your health care provider will ask you about your medical history and perform a physical exam. A chest X-ray or blood testing may be performed to look for other causes of your symptoms or other conditions that may have triggered your asthma attack. How is this treated? Treatment is aimed at reducing inflammation and opening up the airways in your lungs. Most asthma attacks are treated with inhaled medicines. These include quick relief or rescue medicines (such as bronchodilators) and controller medicines (such as inhaled corticosteroids). These medicines are sometimes given through an inhaler or a nebulizer. Systemic steroid medicine taken by mouth or given through an IV tube also can be used to reduce the inflammation when an attack is moderate or severe. Antibiotic medicines are only used if a bacterial infection is present. Follow these instructions at home:  Rest.  Drink plenty of liquids. This helps the mucus to remain thin and be easily coughed up. Only use caffeine in moderation and do not use alcohol until you have recovered from your illness.  Do not smoke. Avoid being exposed to secondhand smoke.  You play a critical role in keeping yourself in good health. Avoid exposure to things that cause you to wheeze or to have breathing problems.  Keep your medicines up-to-date and available. Carefully follow your health care provider's treatment plan.  Take your medicine  exactly as prescribed.  When pollen or pollution is bad, keep windows closed and use an air conditioner or go to places with air conditioning.  Asthma requires careful medical care. See your health care provider for a follow-up as advised. If you  are more than [redacted] weeks pregnant and you were prescribed any new medicines, let your obstetrician know about the visit and how you are doing. Follow up with your health care provider as directed.  After you have recovered from your asthma attack, make an appointment with your outpatient doctor to talk about ways to reduce the likelihood of future attacks. If you do not have a doctor who manages your asthma, make an appointment with a primary care doctor to discuss your asthma. Get help right away if:  You are getting worse.  You have trouble breathing. If severe, call your local emergency services (911 in the [place]  You develop chest pain or discomfort.  You are vomiting.  You are not able to keep fluids down.  You are coughing up yellow, green, brown, or bloody sputum.  You have a fever and your symptoms suddenly get worse.  You have trouble swallowing. This information is not intended to replace advice given to you by your health care provider. Make sure you discuss any questions you have with your health care provider. Document Released: 04/01/2007 Document Revised: 05/28/2016 Document Reviewed: 06/22/2013 Elsevier Interactive Patient Education  2017 [employer].

## 2016-12-01 ENCOUNTER — Ambulatory Visit (INDEPENDENT_AMBULATORY_CARE_PROVIDER_SITE_OTHER): Payer: 59 | Admitting: Internal Medicine

## 2016-12-01 ENCOUNTER — Other Ambulatory Visit (INDEPENDENT_AMBULATORY_CARE_PROVIDER_SITE_OTHER): Payer: 59

## 2016-12-01 ENCOUNTER — Encounter: Payer: Self-pay | Admitting: Internal Medicine

## 2016-12-01 VITALS — BP 114/72 | HR 87 | Ht 68.75 in | Wt 215.0 lb

## 2016-12-01 DIAGNOSIS — J4550 Severe persistent asthma, uncomplicated: Secondary | ICD-10-CM

## 2016-12-01 DIAGNOSIS — J45909 Unspecified asthma, uncomplicated: Secondary | ICD-10-CM

## 2016-12-01 LAB — NITRIC OXIDE
NITRIC OXIDE: 19
Nitric Oxide: 21

## 2016-12-01 LAB — CBC WITH DIFFERENTIAL/PLATELET
BASOS PCT: 0.7 % (ref 0.0–3.0)
Basophils Absolute: 0.1 10*3/uL (ref 0.0–0.1)
EOS ABS: 0.3 10*3/uL (ref 0.0–0.7)
Eosinophils Relative: 2.1 % (ref 0.0–5.0)
HCT: 39.1 % (ref 36.0–46.0)
Hemoglobin: 13.3 g/dL (ref 12.0–15.0)
LYMPHS ABS: 5.4 10*3/uL — AB (ref 0.7–4.0)
Lymphocytes Relative: 36.6 % (ref 12.0–46.0)
MCHC: 34 g/dL (ref 30.0–36.0)
MCV: 89.8 fl (ref 78.0–100.0)
MONO ABS: 0.7 10*3/uL (ref 0.1–1.0)
Monocytes Relative: 5 % (ref 3.0–12.0)
NEUTROS ABS: 8.2 10*3/uL — AB (ref 1.4–7.7)
NEUTROS PCT: 55.6 % (ref 43.0–77.0)
PLATELETS: 296 10*3/uL (ref 150.0–400.0)
RBC: 4.36 Mil/uL (ref 3.87–5.11)
RDW: 13.9 % (ref 11.5–15.5)
WBC: 14.7 10*3/uL — AB (ref 4.0–10.5)

## 2016-12-01 MED ORDER — ALBUTEROL SULFATE (2.5 MG/3ML) 0.083% IN NEBU
2.5000 mg | INHALATION_SOLUTION | Freq: Once | RESPIRATORY_TRACT | Status: AC
  Administered 2016-12-01: 2.5 mg via RESPIRATORY_TRACT

## 2016-12-01 MED ORDER — BUDESONIDE-FORMOTEROL FUMARATE 160-4.5 MCG/ACT IN AERO: INHALATION_SPRAY | RESPIRATORY_TRACT | 11 refills | Status: DC

## 2016-12-01 MED ORDER — ALBUTEROL SULFATE HFA 108 (90 BASE) MCG/ACT IN AERS: 2.0000 | INHALATION_SPRAY | RESPIRATORY_TRACT | 1 refills | Status: DC | PRN

## 2016-12-01 NOTE — Patient Instructions (Addendum)
Plan A =Automatic= Symbicort 160 Take 2 puffs first thing in am and then another 2 puffs about 12 hours later.   Plan B = Backup  Only use your albuterol (Proair) as a rescue medication to be used if you can't catch your breath by resting or doing a relaxed purse lip breathing pattern.  - The less you use it, the better it will work when you need it. - Ok to use up to 2 puffs  every 4 hours if you must but call for immediate appointment if use goes up over your usual need - Don't leave home without it !!  (think of it like the spare tire for your car)   Plan C = Crisis Only use your nebulizer if you first try the proair and it doesn't relieve your symptoms   Please remember to go to the lab department downstairs for your tests - we will call you with the results when they are available and arrange if xolair if possible  Please schedule a follow up office visit in 4 weeks, call sooner if needed with all active medications in hand     .

## 2016-12-01 NOTE — Progress Notes (Signed)
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 Pulmonary Consult    Referred by Dr. Yaakov Guthrie. Pt states dxed Asthma age 45.  She states that she had been on Xolair until Aug 2017. Since stopping Xolair she is feeling more SOB and using albuterol inhaler more than 5 x daily.   presently tapering pred down from 11/26/16 first time in months needed prednisone, note says increased saba need since off xolair but has not filled proventil since last took xolair in 07/2016    No obvious day to day or daytime variability or assoc excess/ purulent sputum or mucus plugs or hemoptysis or cp or chest tightness, subjective wheeze or overt sinus or hb symptoms. No unusual exp hx or h/o childhood pna/ asthma or knowledge of premature birth.  Sleeping ok without nocturnal  or early am exacerbation  of respiratory  c/o's or need for noct saba. Also denies any obvious fluctuation of symptoms with weather or environmental changes or other aggravating or alleviating factors except as outlined above   Current Medications, Allergies, Complete Past Medical History, Past Surgical History, Family History, and Social History were reviewed in [employer] record.  ROS  The following are not active complaints unless bolded sore throat, dysphagia, dental problems, itching, sneezing,  nasal congestion or excess/ purulent secretions, ear ache,   fever, chills, sweats, unintended wt loss, classically pleuritic or exertional cp,  orthopnea pnd or leg swelling, presyncope, palpitations, abdominal pain, anorexia, nausea,  vomiting, diarrhea  or change in bowel or bladder habits, change in stools or urine, dysuria,hematuria,  rash, arthralgias, visual complaints, headache, numbness, weakness or ataxia or problems with walking or coordination,  change in mood/affect or memory.           Review of Systems     Objective:   Physical Exam    amb obese bf nad  Wt Readings from Last 3 Encounters:  12/01/16 215 lb (97.5 kg)  11/25/16 214 lb (97.1 kg)  11/13/16 209 lb (94.8 kg)    Vital signs reviewed - Note on arrival 02 sats  98% on RA     HEENT: nl dentition, turbinates, and oropharynx. Nl external ear canals without cough reflex   NECK :  without JVD/Nodes/TM/ nl carotid upstrokes bilaterally   LUNGS: no acc muscle use,  Nl contour chest with distant late wheeze bilaterally 6 h since last saba    CV:  RRR  no s3 or murmur or increase in P2, nad no edema   ABD:  soft and nontender with nl inspiratory excursion in the supine position. No bruits or organomegaly appreciated, bowel sounds nl  MS:  Nl gait/ ext warm without deformities, calf tenderness, cyanosis or clubbing No obvious joint restrictions   SKIN: warm and dry without lesions    NEURO:  alert, approp, nl sensorium with  no motor or cerebellar deficits apparent.     Labs ordered 12/01/2016  allergy profile  Assessment:

## 2016-12-01 NOTE — Assessment & Plan Note (Addendum)
12/01/2016  After extensive coaching HFA effectiveness =    90% with spacer only  - Allergy profile 12/01/2016 >  Eos 0.3 /  IgE  On pred taper - FENO 12/01/2016  =   19 on pred taper  - Spirometry 12/01/2016  FEV1 1.38 (48%)  Ratio 49 p saba with baseline 1.06 on pred rx but no ics/laba   Chronically difficult to control asthma ? Why. DDX of  difficult airways management almost all start with A and  include Adherence, Ace Inhibitors, Acid Reflux, Active Sinus Disease, Alpha 1 Antitripsin deficiency, Anxiety masquerading as Airways dz,  ABPA,  Allergy(esp in young), Aspiration (esp in elderly), Adverse effects of meds,  Active smokers, A bunch of PE's (a small clot burden can't cause this syndrome unless there is already severe underlying pulm or vascular dz with poor reserve) plus two Bs  = Bronchiectasis and Beta blocker use..and one C= CHF   Adherence is always the initial "prime suspect" and is a multilayered concern that requires a "trust but verify" approach in every patient - starting with knowing how to use medications, especially inhalers, correctly, keeping up with refills and understanding the fundamental difference between maintenance and prns vs those medications only taken for a very short course and then stopped and not refilled.  - not on any controlling med due to cost, which makes no sense as insurance pays for [employer]  - try symb 160 2bid but use a trust but verify approach  ? Allergy > elevated eos so may be candidate for IL-5 if not resuming xolair if document continuing exacerbations on max laba/ics  ? Acid (or non-acid) GERD > always difficult to exclude as up to 75% of pts in some series report no assoc GI/ Heartburn symptoms> rec continue max zantac only for now    Total time devoted to counseling  > 50 % of 60 inoffice visit:  review case with pt/ discussion of options/alternatives/ personally creating written customized instructions  in presence of pt  then going over those  specific  Instructions directly with the pt including how to use all of the meds but in particular covering each new medication in detail and the difference between the maintenance/automatic meds and the prns using an action plan format for the latter.  Please see AVS from this visit for a full list of these instructions

## 2016-12-02 LAB — RESPIRATORY ALLERGY PROFILE REGION II ~~LOC~~
ALLERGEN, COTTONWOOD, T14: 0.15 kU/L — AB
ALLERGEN, D PTERNOYSSINUS, D1: 48.5 kU/L — AB
ALLERGEN, MULBERRY, T70: 0.11 kU/L — AB
Allergen, A. alternata, m6: <0.1 kU/L
Allergen, Cedar tree, t12: 3.01 kU/L — ABNORMAL HIGH
Allergen, Comm Silver Birch, t9: 20.8 kU/L — ABNORMAL HIGH
Allergen, Mouse Urine Protein, e78: 0.12 kU/L — ABNORMAL HIGH
Allergen, Oak,t7: 17.4 kU/L — ABNORMAL HIGH
Allergen, P. notatum, m1: <0.1 kU/L
BERMUDA GRASS: 4.54 kU/L — AB
BOX ELDER: 3.72 kU/L — AB
COCKROACH: 1.38 kU/L — AB
COMMON RAGWEED: 0.55 kU/L — AB
Cat Dander: 27.6 kU/L — ABNORMAL HIGH
D. FARINAE: 49.8 kU/L — AB
DOG DANDER: 32.7 kU/L — AB
ELM IGE: 0.3 kU/L — AB
IgE (Immunoglobulin E), Serum: 1138 kU/L — ABNORMAL HIGH (ref <115)
JOHNSON GRASS: 2.4 kU/L — AB
Pecan/Hickory Tree IgE: 7.36 kU/L — ABNORMAL HIGH
Rough Pigweed  IgE: <0.1 kU/L
SHEEP SORREL IGE: 0.18 kU/L — AB
Timothy Grass: 7.21 kU/L — ABNORMAL HIGH

## 2016-12-03 ENCOUNTER — Telehealth: Payer: Self-pay | Admitting: Internal Medicine

## 2016-12-03 DIAGNOSIS — J4550 Severe persistent asthma, uncomplicated: Secondary | ICD-10-CM

## 2016-12-03 NOTE — Progress Notes (Signed)
lmtcb

## 2016-12-03 NOTE — Telephone Encounter (Signed)
Pt notified of results and referral was made

## 2016-12-03 NOTE — Telephone Encounter (Signed)
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 I attempted to call the number that was listed but was told I had the wrong number.

## 2016-12-16 ENCOUNTER — Telehealth: Payer: Self-pay | Admitting: Obstetrics and Gynecology

## 2016-12-16 NOTE — Telephone Encounter (Signed)
Patient canceled her PUS/OV appointment 12/18/16 through the automated reminder call on 12/15/16. I left patient a message to call and reschedule.

## 2016-12-18 ENCOUNTER — Other Ambulatory Visit: Payer: 59

## 2016-12-18 ENCOUNTER — Other Ambulatory Visit: Payer: 59 | Admitting: Obstetrics and Gynecology

## 2017-01-02 ENCOUNTER — Ambulatory Visit: Payer: 59 | Admitting: Internal Medicine

## 2017-01-06 NOTE — Telephone Encounter (Signed)
Okay to close this encounter? °

## 2017-01-07 NOTE — Telephone Encounter (Signed)
The patient canceled her f/u ultrasound for a h/o a small complex ovarian cyst. I would recommend that the patient have the f/u ultrasound. Please call her, explain that the cyst is likely benign, but given the complex nature, I would recommend a f/u ultrasound.  Then close the encounter.

## 2017-01-12 ENCOUNTER — Other Ambulatory Visit: Payer: Self-pay | Admitting: Physician Assistant

## 2017-01-12 DIAGNOSIS — B3731 Acute candidiasis of vulva and vagina: Secondary | ICD-10-CM

## 2017-01-12 DIAGNOSIS — B373 Candidiasis of vulva and vagina: Secondary | ICD-10-CM

## 2017-01-12 MED ORDER — FLUCONAZOLE 150 MG PO TABS: 150.0000 mg | ORAL_TABLET | Freq: Once | ORAL | 0 refills | Status: AC

## 2017-01-16 ENCOUNTER — Other Ambulatory Visit: Payer: Self-pay | Admitting: Family Medicine

## 2017-01-16 MED ORDER — FLUCONAZOLE 150 MG PO TABS: 150.0000 mg | ORAL_TABLET | Freq: Once | ORAL | 0 refills | Status: AC

## 2017-01-16 MED ORDER — METRONIDAZOLE 500 MG PO TABS: 500.0000 mg | ORAL_TABLET | Freq: Two times a day (BID) | ORAL | 0 refills | Status: AC

## 2017-01-16 NOTE — Progress Notes (Signed)
Pt was treated for asthmatic bronchitis with steroids and antibiotics She now has vaginitis Will treat with diflucan and flagyl

## 2017-01-21 ENCOUNTER — Encounter: Payer: Self-pay | Admitting: Internal Medicine

## 2017-01-21 ENCOUNTER — Ambulatory Visit (INDEPENDENT_AMBULATORY_CARE_PROVIDER_SITE_OTHER): Payer: 59 | Admitting: Internal Medicine

## 2017-01-21 VITALS — BP 132/80 | HR 99 | Ht 68.75 in | Wt 212.8 lb

## 2017-01-21 DIAGNOSIS — J4550 Severe persistent asthma, uncomplicated: Secondary | ICD-10-CM

## 2017-01-21 LAB — NITRIC OXIDE: Nitric Oxide: 23

## 2017-01-21 MED ORDER — PREDNISONE 10 MG PO TABS: ORAL_TABLET | ORAL | 0 refills | Status: DC

## 2017-01-21 NOTE — Telephone Encounter (Signed)
Follow-up call to patient regarding canceled pelvic ultrasound. Advised Dr Talbert Nan highly recommends follow-up scan to ensure resolution of this cyst, particularly due to the complex appearance of the cyst. Patient states she will reschedule but "not right now" and declines to schedule in a few weeks. States her office is "short staffed" and she will call back. She states her sister has been diagnosed with breast cancer and she knows this is important. Advised again that it would be Dr Gentry Fitz recommendation that she proceed with follow-up scan at earliest convenience so that cyst is not unresolved, or progressing without our knowledge. Patient states she does not have pain or any symptoms to make her feel concerned but states we "dont have to worry" she will call back to schedule.   Routing to provider for final review. Will close encounter.

## 2017-01-21 NOTE — Progress Notes (Signed)
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 Pulmonary Consult    Referred by Dr. Yaakov Guthrie. Pt states dxed Asthma age 46.  She states that she had been on Xolair until Aug 2017. Since stopping Xolair she is feeling more SOB and using albuterol inhaler more than 5 x daily.   presently tapering pred down from 11/26/16 first time in months needed prednisone, note says increased saba need since off xolair but has not filled proventil since last took xolair in 07/2016  rec Plan A =Automatic= Symbicort 160 Take 2 puffs first thing in am and then another 2 puffs about 12 hours later.  Plan B = Backup  Only use your albuterol (Proair) as a rescue medication   Plan C = Crisis Only use your nebulizer if you first try the proair and it doesn't relieve your symptoms   Please schedule a follow up office visit in 4 weeks, call sooner if needed with all active medications in hand       01/21/2017  f/u ov/[place] re: severe chronic asthma / over use of albuterol last on xolair Aug 2017  Chief Complaint  Patient presents with  . Follow-up    Breathing has improved some. She is using albuterol inhaler 1-2 x daily on average.   doesn't use albuterol "on slower days at work" - always with exertion/ maybe once a month needs it p waking up sob which represents a decrease over previous use while maint on symb 160 2bid with ok technique No albuterol on day of ov    No obvious day to day or daytime variability or assoc excess/ purulent sputum or mucus plugs or hemoptysis or  cp or chest tightness, subjective wheeze or overt sinus or hb symptoms. No unusual exp hx or h/o childhood pna/ asthma or knowledge of premature birth.  Sleeping ok without nocturnal  or early am exacerbation  of respiratory  c/o's or need for noct saba. Also denies any obvious fluctuation of symptoms with weather or environmental changes or other aggravating or alleviating factors except as outlined above   Current Medications, Allergies, Complete Past Medical History, Past Surgical History, Family History, and Social History were reviewed in [employer] record.  ROS  The following are not active complaints unless bolded sore throat, dysphagia, dental problems, itching, sneezing,  nasal congestion or excess/ purulent secretions, ear ache,   fever, chills, sweats, unintended wt loss, classically pleuritic or exertional cp,  orthopnea pnd or leg swelling, presyncope, palpitations, abdominal pain, anorexia, nausea, vomiting, diarrhea  or change in bowel or bladder habits, change in stools or urine, dysuria,hematuria,  rash, arthralgias, visual complaints, headache, numbness, weakness or ataxia or problems with walking or coordination,  change in mood/affect or memory.                Objective:   Physical Exam    amb obese bf nad  01/21/2017  213   12/01/16 215 lb (97.5 kg)  11/25/16 214 lb (97.1 kg)  11/13/16 209 lb (94.8 kg)    Vital signs reviewed - Note on arrival 02 sats  99% on RA     HEENT: nl dentition, turbinates, and oropharynx. Nl external ear canals without cough reflex   NECK :  without JVD/Nodes/TM/ nl carotid upstrokes bilaterally   LUNGS: no acc muscle use,  Nl contour chest with  Minima insp pops/squeaks bilaterally/no exp wheeze     CV:  RRR  no s3 or murmur or increase in P2, nad no edema   ABD:  soft and nontender with nl inspiratory excursion in the supine position. No bruits or organomegaly appreciated, bowel sounds nl  MS:  Nl  gait/ ext warm without deformities, calf tenderness, cyanosis or clubbing No obvious joint restrictions   SKIN: warm and dry without lesions    NEURO:  alert, approp, nl sensorium with  no motor or cerebellar deficits apparent.              I personally reviewed images and agree with radiology impression as follows:  CXR:   10/05/16  No active cardiopulmonary disease      Assessment:

## 2017-01-21 NOTE — Patient Instructions (Addendum)
Plan A =Automatic= Symbicort 160 Take 2 puffs first thing in am and then another 2 puffs about 12 hours later.   Plan B = Backup  Only use your albuterol (Proair) as a rescue medication to be used if you can't catch your breath by resting or doing a relaxed purse lip breathing pattern.  - The less you use it, the better it will work when you need it. - Ok to use up to 2 puffs  every 4 hours if you must but call for immediate appointment if use goes up over your usual need - Don't leave home without it !!  (think of it like the spare tire for your car)   Plan C = Crisis Only use your nebulizer if you first try the proair and it doesn't relieve your symptoms   Plan D = Deltasone = If having to use more plan C than usual >> Prednisone 10 mg take  4 each am x 2 days,   2 each am x 2 days,  1 each am x 2 days and stop     Please see patient coordinator before you leave today  to schedule allergy eval for xolair and alternatuves by Dr Bruna Potter group.  Once established with allergy we can see you here as needed and let the allergist refill you asthma medications     .

## 2017-01-22 ENCOUNTER — Ambulatory Visit (INDEPENDENT_AMBULATORY_CARE_PROVIDER_SITE_OTHER): Payer: 59 | Admitting: Family Medicine

## 2017-01-22 VITALS — BP 128/80 | HR 87 | Temp 98.7°F | Resp 16 | Ht 68.3 in | Wt 207.2 lb

## 2017-01-22 DIAGNOSIS — R52 Pain, unspecified: Secondary | ICD-10-CM

## 2017-01-22 DIAGNOSIS — R062 Wheezing: Secondary | ICD-10-CM

## 2017-01-22 LAB — POCT INFLUENZA A/B
INFLUENZA A, POC: NEGATIVE
INFLUENZA B, POC: NEGATIVE

## 2017-01-22 MED ORDER — AZITHROMYCIN 250 MG PO TABS: ORAL_TABLET | ORAL | 0 refills | Status: DC

## 2017-01-22 MED ORDER — PREDNISONE 20 MG PO TABS: 40.0000 mg | ORAL_TABLET | Freq: Every day | ORAL | 0 refills | Status: DC

## 2017-01-22 MED ORDER — ALBUTEROL SULFATE (2.5 MG/3ML) 0.083% IN NEBU
2.5000 mg | INHALATION_SOLUTION | Freq: Once | RESPIRATORY_TRACT | Status: AC
  Administered 2017-01-22: 2.5 mg via RESPIRATORY_TRACT

## 2017-01-22 MED ORDER — IPRATROPIUM BROMIDE 0.02 % IN SOLN
0.5000 mg | Freq: Once | RESPIRATORY_TRACT | Status: AC
  Administered 2017-01-22: 0.5 mg via RESPIRATORY_TRACT

## 2017-01-22 NOTE — Patient Instructions (Addendum)
Start Prednisone 40 mg x 5 days. Start Start Azithromycin Take 2 tabs x 1 dose, then 1 tab every day for x 4 days Continue inhalers and neb treatments as prescribed.  IF you received an x-ray today, you will receive an invoice from [facility]. Please contact [facility] at [phone] with questions or concerns regarding your invoice.   IF you received labwork today, you will receive an invoice from [facility]. Please contact [facility] at [phone] with questions or concerns regarding your invoice.   Our billing staff will not be able to assist you with questions regarding bills from these companies.  You will be contacted with the lab results as soon as they are available. The fastest way to get your results is to activate your My Chart account. Instructions are located on the last page of this paperwork. If you have not heard from [place] regarding the results in 2 weeks, please contact this office.

## 2017-01-22 NOTE — Assessment & Plan Note (Signed)
Body mass index is 31.65 kg/m.  No results found for: TSH   Contributing to gerd tendency/ doe/reviewed the need and the process to achieve and maintain neg calorie balance > defer f/u primary care including intermittently monitoring thyroid status

## 2017-01-22 NOTE — Assessment & Plan Note (Signed)
12/01/2016  After extensive coaching HFA effectiveness =    90% with spacer only  - Allergy profile 12/01/2016 >  Eos 0.3 /  IgE 1128 multiple pos RAST On pred taper - FENO 12/01/2016  =   19 on pred taper  - Spirometry 12/01/2016  FEV1 1.38 (48%)  Ratio 49 p saba with baseline 1.06 on pred rx but no ics/laba  - started symb 160 2bid  - FENO 01/21/2017  =   23  On symbicort 160 2bid  - Spirometry 01/21/2017  FEV1 1.27 (44%)  Ratio 52 p am symb with non physiologic f/v    She clearly has severe asthma with atopic features though I suspect also has a fixed component from remodeling that leaves her also sob with activity - not the same as EIA and no reason to over use saba for that purpose.  rec refer to allergy to consider restart xolair vs one of the new IL-5 inhibitors depending on insurance issues   Discussed in detail all the  indications, usual  risks and alternatives  relative to the benefits with patient who agrees to proceed with recs as outlined     I had an extended discussion with the patient reviewing all relevant studies completed to date and  lasting 15 to 20 minutes of a 25 minute visit    Each maintenance medication was reviewed in detail including most importantly the difference between maintenance and prns and under what circumstances the prns are to be triggered using an action plan format that is not reflected in the computer generated alphabetically organized AVS.    Please see AVS for specific instructions unique to this visit that I personally wrote and verbalized to the the pt in detail and then reviewed with pt  by my nurse highlighting any  changes in therapy recommended at today's visit to their plan of care.

## 2017-01-22 NOTE — Progress Notes (Signed)
Patient ID: Deborah Cordova, female    DOB: 07-11-1971, 46 y.o.   MRN: [id number]  PCP: Anthoney Harada, MD  Chief Complaint  Patient presents with  . Fever    X 1 DAY  . Cough    x 2 days , pt  says its brown and yellow  . Chills    X Last night, hot and cold   . Generalized Body Aches    X 3 DAYS    Subjective:  HPI 46 year old female presents for evaluation of wheezing, cough, chills, and low grade fever. Past medical hx includes: Hypertension and COPD. She reports body aches x 2 days with productive cough (yellow and brown phlegm) and chest tightness. She is prescribed a maintanence and rescue inhaler for COPD, and has used consistently through the course of this illness.  Patient see Dr. Melvyn Novas at [facility] pulmonology and reports he recently placed her on Symbicort which has not improved her wheezing. Reports that she administered two nebulizer treatment yesterday and her ability to move air hasn't improved. Denies sore throat or fever.  Social History   Social History  . Marital status: Married    Spouse name: N/A  . Number of children: N/A  . Years of education: N/A   Occupational History  . Not on file.   Social History Main Topics  . Smoking status: Never Smoker  . Smokeless tobacco: Never Used  . Alcohol use 0.6 - 1.8 oz/week    1 - 3 Glasses of wine per week  . Drug use: No  . Sexual activity: Yes    Partners: Male    Birth control/ protection: Surgical   Other Topics Concern  . Not on file   Social History Narrative  . No narrative on file    Family History  Problem Relation Age of Onset  . Depression Mother   . Diabetes Mother   . Hepatitis C Mother   . Hypertension Mother   . Asthma Mother   . COPD Father   . Emphysema Father     smoked  . Hepatitis C Father   . Asthma Sister   . Breast cancer Sister   . Asthma Brother    Review of Systems See HPI Patient Active Problem List   Diagnosis Date Noted  . Morbid obesity due to excess  calories ([place]) 01/22/2017  . Hypertension   . COPD (chronic obstructive pulmonary disease) ([place])   . Severe persistent asthma without complication     Allergies  Allergen Reactions  . Aspirin Other (See Comments)    Asthma flare up - wheezing  . Cantaloupe (Diagnostic) Itching and Other (See Comments)    Makes tongue itch  . Kiwi Extract Itching and Other (See Comments)    Makes tongue itch  . Nsaids Other (See Comments)    Asthma flare up - wheezing   . Other Other (See Comments)    Pecans and walnuts---induce asthma attack   . Pineapple Other (See Comments)    Tongue bleeds  . Pyridium [Phenazopyridine Hcl] Itching    Itching, swelling and burning    Prior to Admission medications   Medication Sig Start Date End Date Taking? Authorizing Provider  acetaminophen (TYLENOL) 500 MG tablet Take 1,000 mg by mouth every 6 (six) hours as needed for headache.   Yes Historical Provider, MD  acyclovir (ZOVIRAX) 400 MG tablet Take 400 mg by mouth as needed (For fever blisters.).   Yes Historical Provider, MD  albuterol (PROAIR  HFA) 108 (90 Base) MCG/ACT inhaler Inhale 2 puffs into the lungs every 4 (four) hours as needed for wheezing or shortness of breath. 12/01/16  Yes Tanda Rockers, MD  amLODipine (NORVASC) 5 MG tablet Take 5 mg by mouth daily.   Yes Historical Provider, MD  budesonide-formoterol (SYMBICORT) 160-4.5 MCG/ACT inhaler Take 2 puffs first thing in am and then another 2 puffs about 12 hours later. 12/01/16  Yes Tanda Rockers, MD  butalbital-acetaminophen-caffeine (FIORICET, ESGIC) [phone] MG tablet Take 0.5 tablets by mouth 2 (two) times daily as needed for headache.   Yes Historical Provider, MD  cetirizine (ZYRTEC ALLERGY) 10 MG tablet Take 10 mg by mouth daily.   Yes Historical Provider, MD  gabapentin (NEURONTIN) 300 MG capsule Take 300 mg by mouth daily as needed (restless leg).   Yes Historical Provider, MD  metroNIDAZOLE (FLAGYL) 500 MG tablet Take 1 tablet (500 mg  total) by mouth 2 (two) times daily. 01/16/17 01/23/17 Yes Zoe A Nolon Rod, MD  ondansetron (ZOFRAN) 4 MG tablet Take 1 tablet (4 mg total) by mouth every 8 (eight) hours as needed for nausea or vomiting. 10/05/16  Yes Domenic Moras, PA-C  predniSONE (DELTASONE) 10 MG tablet Take  4 each am x 2 days,   2 each am x 2 days,  1 each am x 2 days and stop 01/21/17  Yes Tanda Rockers, MD  ranitidine (ZANTAC) 150 MG tablet Take 150 mg by mouth 2 (two) times daily.   Yes Historical Provider, MD  Past Medical, Surgical Family and Social History reviewed and updated.    Objective:   Today's Vitals   01/22/17 0957  BP: 128/80  Pulse: 87  Resp: 16  Temp: 98.7 F (37.1 C)  TempSrc: Oral  SpO2: 99%  Weight: 207 lb 3.2 oz (94 kg)  Height: 5' 8.3" (1.735 m)    Wt Readings from Last 3 Encounters:  01/22/17 207 lb 3.2 oz (94 kg)  01/21/17 212 lb 12.8 oz (96.5 kg)  12/01/16 215 lb (97.5 kg)   Physical Exam  Constitutional: She appears well-developed and well-nourished.  HENT:  Head: Normocephalic and atraumatic.  Right Ear: External ear normal.  Left Ear: External ear normal.  Nose: Rhinorrhea present.  Mouth/Throat: Oropharynx is clear and moist.  Eyes: Conjunctivae and EOM are normal. Pupils are equal, round, and reactive to light.  Neck: Normal range of motion. Neck supple.  Cardiovascular: Normal rate, regular rhythm, normal heart sounds and intact distal pulses.   Pulmonary/Chest: She has wheezes. She exhibits tenderness.  Musculoskeletal: Normal range of motion.  Skin: Skin is warm and dry.  Psychiatric: She has a normal mood and affect. Her behavior is normal. Judgment and thought content normal.     Assessment & Plan:  1. Body aches - POCT Influenza A/B  2. Wheezing - albuterol (PROVENTIL) (2.5 MG/3ML) 0.083% nebulizer solution 2.5 mg; Take 3 mLs (2.5 mg total) by nebulization once. - ipratropium (ATROVENT) nebulizer solution 0.5 mg; Take 2.5 mLs (0.5 mg total) by nebulization  once.  Plan: -Start Azithromycin (Zithromax) 250 mg take as directed -Prednisone 40 mg x 5 days -Influenza screen negative  -Continue Symbicort and Proventil as prescribed  Return for follow-up if symptoms do not improve.  Carroll Sage. Kenton Kingfisher, MSN, FNP-C [facility] [place]

## 2017-01-26 ENCOUNTER — Other Ambulatory Visit: Payer: Self-pay | Admitting: Family Medicine

## 2017-01-26 MED ORDER — BUDESONIDE-FORMOTEROL FUMARATE 160-4.5 MCG/ACT IN AERO: INHALATION_SPRAY | RESPIRATORY_TRACT | 11 refills | Status: DC

## 2017-01-26 MED ORDER — FLUTICASONE FUROATE-VILANTEROL 100-25 MCG/INH IN AEPB: 1.0000 | INHALATION_SPRAY | Freq: Every day | RESPIRATORY_TRACT | 11 refills | Status: DC

## 2017-01-26 MED ORDER — HYDROCOD POLST-CPM POLST ER 10-8 MG/5ML PO SUER: 5.0000 mL | Freq: Two times a day (BID) | ORAL | 0 refills | Status: DC | PRN

## 2017-01-26 NOTE — Progress Notes (Signed)
Patient reports that symptoms of wheezing and shortness of breath  are still persistent. She had completed course of antibiotics, prednisone, continues to use Symbicort inhaler twice daily and Albuterol without relief. Reports that since starting symbicort, wheezing and dyspnea have been more troublesome.  I am advising patient to continue Symbicort , adding Breo Ellipta once daily, and Tussionex for cough.  Please schedule a follow-up in 3 weeks.   Carroll Sage. Kenton Kingfisher, MSN, FNP-C [facility] [place]

## 2017-02-04 MED FILL — raNITIdine HCL 150 MG TABS: 150 | 90 days supply | Qty: 180 | Fill #0

## 2017-02-04 MED FILL — ACYCLOVIR 400 MG TABLET: 400 | 90 days supply | Qty: 180 | Fill #0

## 2017-02-04 MED FILL — MONTELUKAST SOD 10 MG TAB: 10 | 90 days supply | Qty: 90 | Fill #0

## 2017-02-04 MED FILL — VENTOLIN HFA 90 MCG INHALER: 108 (90 BAS | 75 days supply | Qty: 54 | Fill #0 | Status: TO

## 2017-02-04 MED FILL — AMLODIPINE BESYLATE 5 MG TA: 5 | 90 days supply | Qty: 90 | Fill #0 | Status: TO

## 2017-02-06 ENCOUNTER — Ambulatory Visit (INDEPENDENT_AMBULATORY_CARE_PROVIDER_SITE_OTHER): Payer: 59 | Admitting: Emergency Medicine

## 2017-02-06 VITALS — BP 112/80 | HR 91 | Temp 98.2°F | Resp 16 | Ht 68.75 in | Wt 207.6 lb

## 2017-02-06 DIAGNOSIS — H1032 Unspecified acute conjunctivitis, left eye: Secondary | ICD-10-CM

## 2017-02-06 MED ORDER — TOBRAMYCIN 0.3 % OP SOLN: 2.0000 [drp] | OPHTHALMIC | 1 refills | Status: AC

## 2017-02-06 MED FILL — TOBRAMYCIN 0.3% EYE DROPS: 0.3 | 9 days supply | Qty: 5 | Fill #0

## 2017-02-06 NOTE — Progress Notes (Signed)
Deborah Cordova 46 y.o.   Chief Complaint  Patient presents with  . Eye Problem    LEFT WITH REDNESS and SORNESS 02/06/17    HISTORY OF PRESENT ILLNESS: This is a 46 y.o. female complaining of left eye redness with discharge this am.  Eye Problem   The left eye is affected. This is a new problem. The current episode started today. The problem occurs constantly. The problem has been gradually worsening. There was no injury mechanism. The pain is at a severity of 3/10. The pain is mild. She does not wear contacts. Associated symptoms include an eye discharge, eye redness, itching and photophobia. Pertinent negatives include no blurred vision, double vision, fever, nausea, recent URI or vomiting.     Prior to Admission medications   Medication Sig Start Date End Date Taking? Authorizing Provider  acetaminophen (TYLENOL) 500 MG tablet Take 1,000 mg by mouth every 6 (six) hours as needed for headache.    Historical Provider, MD  acyclovir (ZOVIRAX) 400 MG tablet Take 400 mg by mouth as needed (For fever blisters.).    Historical Provider, MD  albuterol (PROAIR HFA) 108 (90 Base) MCG/ACT inhaler Inhale 2 puffs into the lungs every 4 (four) hours as needed for wheezing or shortness of breath. 12/01/16   Tanda Rockers, MD  amLODipine (NORVASC) 5 MG tablet Take 5 mg by mouth daily.    Historical Provider, MD  azithromycin (ZITHROMAX) 250 MG tablet Take 2 tabs PO x 1 dose, then 1 tab PO QD x 4 days 01/22/17   Sedalia Muta, FNP  budesonide-formoterol [facility]) 160-4.5 MCG/ACT inhaler Take 2 puffs first thing in am and then another 2 puffs about 12 hours later. 01/26/17   Sedalia Muta, FNP  butalbital-acetaminophen-caffeine (FIORICET, ESGIC) [phone] MG tablet Take 0.5 tablets by mouth 2 (two) times daily as needed for headache.    Historical Provider, MD  cetirizine (ZYRTEC ALLERGY) 10 MG tablet Take 10 mg by mouth daily.    Historical Provider, MD    chlorpheniramine-HYDROcodone (TUSSIONEX PENNKINETIC ER) 10-8 MG/5ML SUER Take 5 mLs by mouth every 12 (twelve) hours as needed for cough. 01/26/17   Sedalia Muta, FNP  fluticasone furoate-vilanterol (BREO ELLIPTA) 100-25 MCG/INH AEPB Inhale 1 puff into the lungs daily. 01/26/17   Sedalia Muta, FNP  gabapentin (NEURONTIN) 300 MG capsule Take 300 mg by mouth daily as needed (restless leg).    Historical Provider, MD  ondansetron (ZOFRAN) 4 MG tablet Take 1 tablet (4 mg total) by mouth every 8 (eight) hours as needed for nausea or vomiting. 10/05/16   Domenic Moras, PA-C  predniSONE (DELTASONE) 20 MG tablet Take 2 tablets (40 mg total) by mouth daily with breakfast. 01/22/17   Sedalia Muta, FNP  ranitidine (ZANTAC) 150 MG tablet Take 150 mg by mouth 2 (two) times daily.    Historical Provider, MD  tobramycin (TOBREX) 0.3 % ophthalmic solution Place 2 drops into the left eye every 4 (four) hours. 02/06/17 02/11/17  Horald Pollen, MD    Allergies  Allergen Reactions  . Aspirin Other (See Comments)    Asthma flare up - wheezing  . Cantaloupe (Diagnostic) Itching and Other (See Comments)    Makes tongue itch  . Kiwi Extract Itching and Other (See Comments)    Makes tongue itch  . Nsaids Other (See Comments)    Asthma flare up - wheezing   . Other Other (See Comments)    Pecans and walnuts---induce asthma attack   .  Pineapple Other (See Comments)    Tongue bleeds  . Pyridium [Phenazopyridine Hcl] Itching    Itching, swelling and burning    Patient Active Problem List   Diagnosis Date Noted  . Morbid obesity due to excess calories ([place]) 01/22/2017  . Hypertension   . COPD (chronic obstructive pulmonary disease) ([place])   . Severe persistent asthma without complication     Past Medical History:  Diagnosis Date  . Asthma   . COPD (chronic obstructive pulmonary disease) ([place])   . Fibroid   . Genital warts   . Hypertension   . PID (pelvic inflammatory  disease)   . Renal disorder     Past Surgical History:  Procedure Laterality Date  . COLPOSCOPY    . DILATION AND CURETTAGE OF UTERUS    . LAPAROSCOPIC TOTAL HYSTERECTOMY    . laser genital warts      Social History   Social History  . Marital status: Married    Spouse name: N/A  . Number of children: N/A  . Years of education: N/A   Occupational History  . Not on file.   Social History Main Topics  . Smoking status: Never Smoker  . Smokeless tobacco: Never Used  . Alcohol use 0.6 - 1.8 oz/week    1 - 3 Glasses of wine per week  . Drug use: No  . Sexual activity: Yes    Partners: Male    Birth control/ protection: Surgical   Other Topics Concern  . Not on file   Social History Narrative  . No narrative on file    Family History  Problem Relation Age of Onset  . Depression Mother   . Diabetes Mother   . Hepatitis C Mother   . Hypertension Mother   . Asthma Mother   . COPD Father   . Emphysema Father     smoked  . Hepatitis C Father   . Asthma Sister   . Breast cancer Sister   . Asthma Brother      Review of Systems  Constitutional: Negative for fever.  Eyes: Positive for photophobia, discharge, redness and itching. Negative for blurred vision and double vision.  Gastrointestinal: Negative for nausea and vomiting.  All other systems reviewed and are negative.  Vitals:   02/06/17 0742  BP: 112/80  Pulse: 91  Resp: 16  Temp: 98.2 F (36.8 C)    Physical Exam  Constitutional: She is oriented to person, place, and time. She appears well-developed and well-nourished.  HENT:  Head: Normocephalic and atraumatic.  Eyes: EOM are normal. Pupils are equal, round, and reactive to light. Left eye exhibits discharge. Left conjunctiva is injected.  Cardiovascular: Normal rate.   Pulmonary/Chest: Effort normal.  Neurological: She is alert and oriented to person, place, and time.  Skin: Skin is warm and dry.  Psychiatric: She has a normal mood and affect.  Her behavior is normal.  Vitals reviewed.    ASSESSMENT & PLAN: Deborah Cordova was seen today for eye problem.  Diagnoses and all orders for this visit:  Acute conjunctivitis of left eye, unspecified acute conjunctivitis type  Other orders -     tobramycin (TOBREX) 0.3 % ophthalmic solution; Place 2 drops into the left eye every 4 (four) hours.    Patient Instructions       IF you received an x-ray today, you will receive an invoice from [facility]. Please contact [facility] at [phone] with questions or concerns regarding your invoice.   IF you  received labwork today, you will receive an invoice from [facility]. Please contact [facility] at [phone] with questions or concerns regarding your invoice.   Our billing staff will not be able to assist you with questions regarding bills from these companies.  You will be contacted with the lab results as soon as they are available. The fastest way to get your results is to activate your My Chart account. Instructions are located on the last page of this paperwork. If you have not heard from [place] regarding the results in 2 weeks, please contact this office.     Bacterial Conjunctivitis Introduction Bacterial conjunctivitis is an infection of your conjunctiva. This is the clear membrane that covers the white part of your eye and the inner surface of your eyelid. This condition can make your eye:  Red or pink.  Itchy. This condition is caused by bacteria. This condition spreads very easily from person to person (is contagious) and from one eye to the other eye. Follow these instructions at home: Medicines  Take or apply your antibiotic medicine as told by your doctor. Do not stop taking or applying the antibiotic even if you start to feel better.  Take or apply over-the-counter and prescription medicines only as told by your doctor.  Do not touch your eyelid with the eye drop bottle or the ointment tube. Managing  discomfort  Wipe any fluid from your eye with a warm, wet washcloth or a cotton ball.  Place a cool, clean washcloth on your eye. Do this for 10-20 minutes, 3-4 times per day. General instructions  Do not wear contact lenses until the irritation is gone. Wear glasses until your doctor says it is okay to wear contacts.  Do not wear eye makeup until your symptoms are gone. Throw away any old makeup.  Change or wash your pillowcase every day.  Do not share towels or washcloths with anyone.  Wash your hands often with soap and water. Use paper towels to dry your hands.  Do not touch or rub your eyes.  Do not drive or use heavy machinery if your vision is blurry. Contact a doctor if:  You have a fever.  Your symptoms do not get better after 10 days. Get help right away if:  You have a fever and your symptoms suddenly get worse.  You have very bad pain when you move your eye.  Your face:  Hurts.  Is red.  Is swollen.  You have sudden loss of vision. This information is not intended to replace advice given to you by your health care provider. Make sure you discuss any questions you have with your health care provider. Document Released: 09/23/2008 Document Revised: 05/22/2016 Document Reviewed: 09/27/2015  2017 Elsevier      Agustina Caroli, MD [facility]

## 2017-02-06 NOTE — Patient Instructions (Addendum)
     IF you received an x-ray today, you will receive an invoice from [facility]. Please contact [facility] at [phone] with questions or concerns regarding your invoice.   IF you received labwork today, you will receive an invoice from [facility]. Please contact [facility] at [phone] with questions or concerns regarding your invoice.   Our billing staff will not be able to assist you with questions regarding bills from these companies.  You will be contacted with the lab results as soon as they are available. The fastest way to get your results is to activate your My Chart account. Instructions are located on the last page of this paperwork. If you have not heard from [place] regarding the results in 2 weeks, please contact this office.     Bacterial Conjunctivitis Introduction Bacterial conjunctivitis is an infection of your conjunctiva. This is the clear membrane that covers the white part of your eye and the inner surface of your eyelid. This condition can make your eye:  Red or pink.  Itchy. This condition is caused by bacteria. This condition spreads very easily from person to person (is contagious) and from one eye to the other eye. Follow these instructions at home: Medicines  Take or apply your antibiotic medicine as told by your doctor. Do not stop taking or applying the antibiotic even if you start to feel better.  Take or apply over-the-counter and prescription medicines only as told by your doctor.  Do not touch your eyelid with the eye drop bottle or the ointment tube. Managing discomfort  Wipe any fluid from your eye with a warm, wet washcloth or a cotton ball.  Place a cool, clean washcloth on your eye. Do this for 10-20 minutes, 3-4 times per day. General instructions  Do not wear contact lenses until the irritation is gone. Wear glasses until your doctor says it is okay to wear contacts.  Do not wear eye makeup until your symptoms are gone.  Throw away any old makeup.  Change or wash your pillowcase every day.  Do not share towels or washcloths with anyone.  Wash your hands often with soap and water. Use paper towels to dry your hands.  Do not touch or rub your eyes.  Do not drive or use heavy machinery if your vision is blurry. Contact a doctor if:  You have a fever.  Your symptoms do not get better after 10 days. Get help right away if:  You have a fever and your symptoms suddenly get worse.  You have very bad pain when you move your eye.  Your face:  Hurts.  Is red.  Is swollen.  You have sudden loss of vision. This information is not intended to replace advice given to you by your health care provider. Make sure you discuss any questions you have with your health care provider. Document Released: 09/23/2008 Document Revised: 05/22/2016 Document Reviewed: 09/27/2015  2017 Elsevier

## 2017-02-11 IMAGING — US US PELVIS COMPLETE
1 series · 14 of 25 positions shown · non-contrast
Comparison: CT 10/05/2016

CLINICAL DATA: Left adnexal tenderness for 1 month.

EXAM:
TRANSABDOMINAL AND TRANSVAGINAL ULTRASOUND OF PELVIS
DOPPLER ULTRASOUND OF OVARIES
TECHNIQUE: Both transabdominal and transvaginal ultrasound examinations of the
pelvis were performed. Transabdominal technique was performed for
global imaging of the pelvis including uterus, ovaries, adnexal
regions, and pelvic cul-de-sac.
It was necessary to proceed with endovaginal exam following the
transabdominal exam to visualize the ovaries. Color and duplex
Doppler ultrasound was utilized to evaluate blood flow to the
ovaries.

[Series 1: us pelvis complete · 0.24mm/px · 14 of 84 slices shown]
[im 1/84]
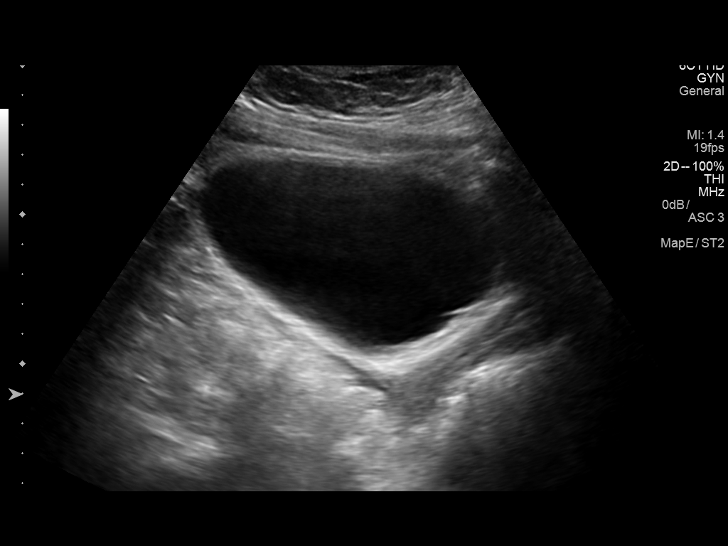
[im 7/84]
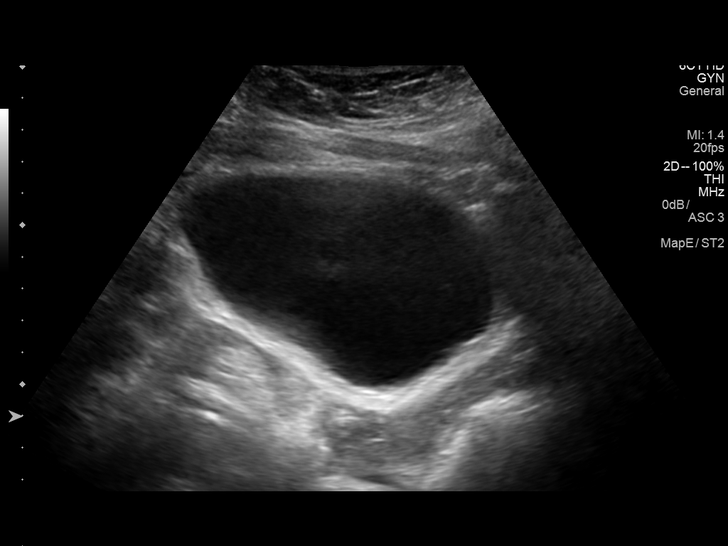
[im 14/84]
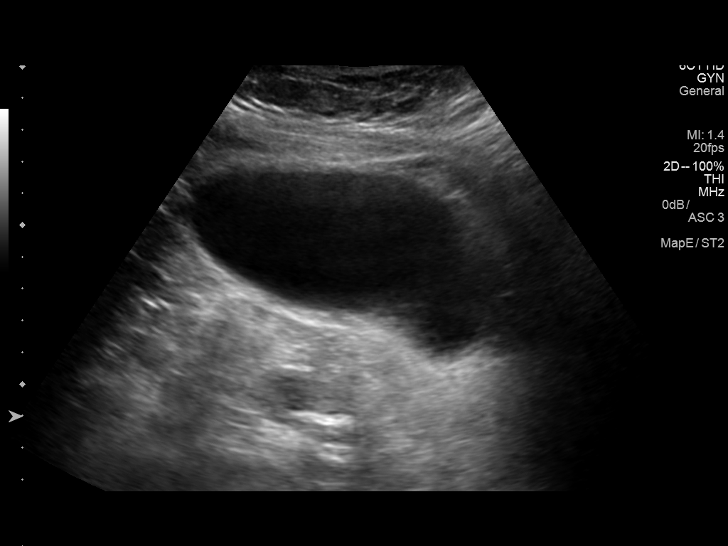
[im 21/84]
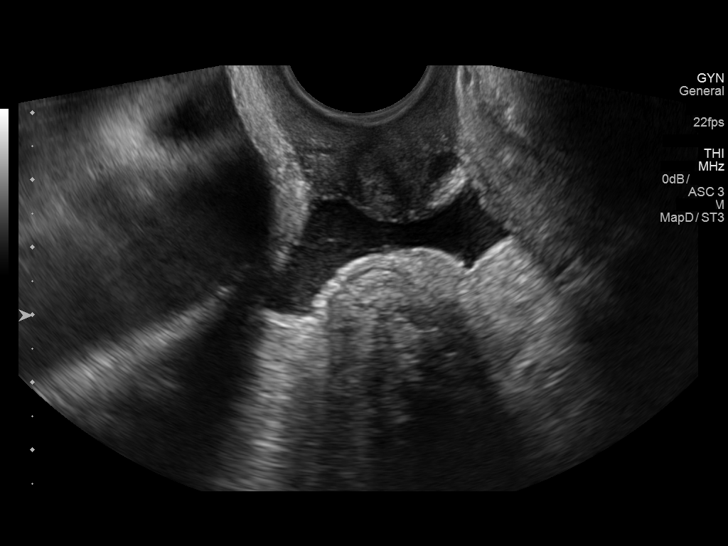
[im 28/84]
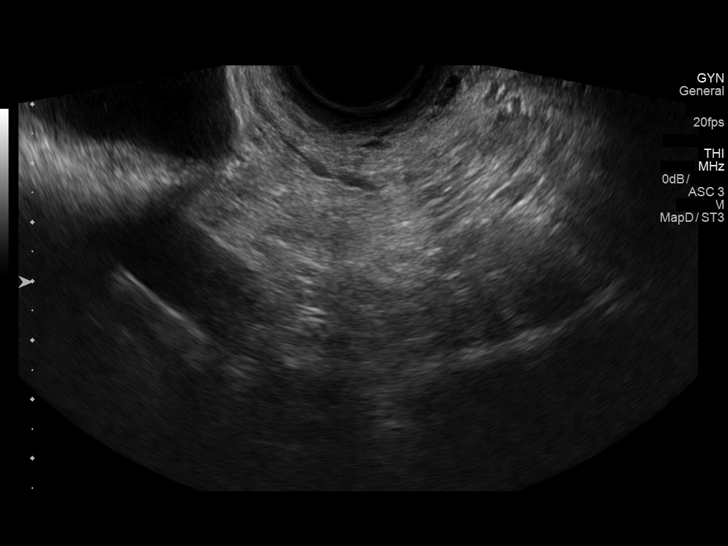
[im 32/84]
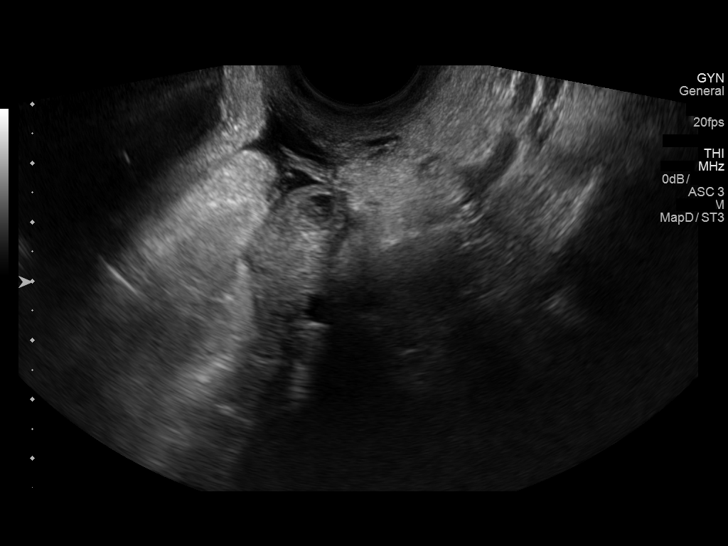
[im 39/84]
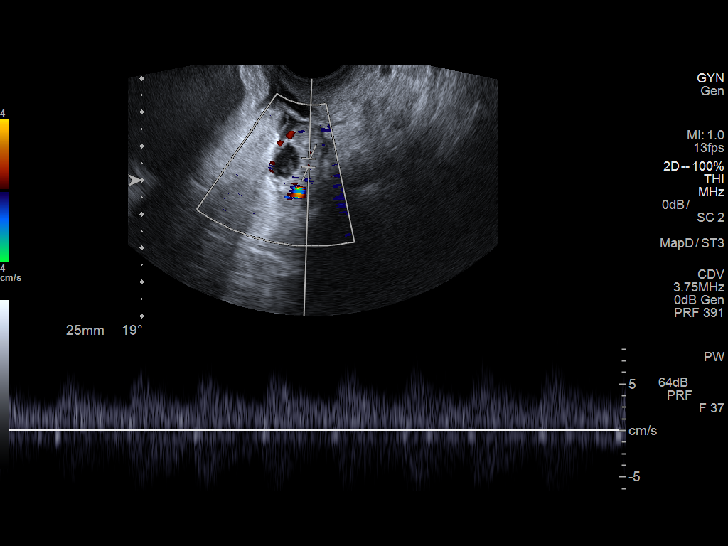
[im 45/84]
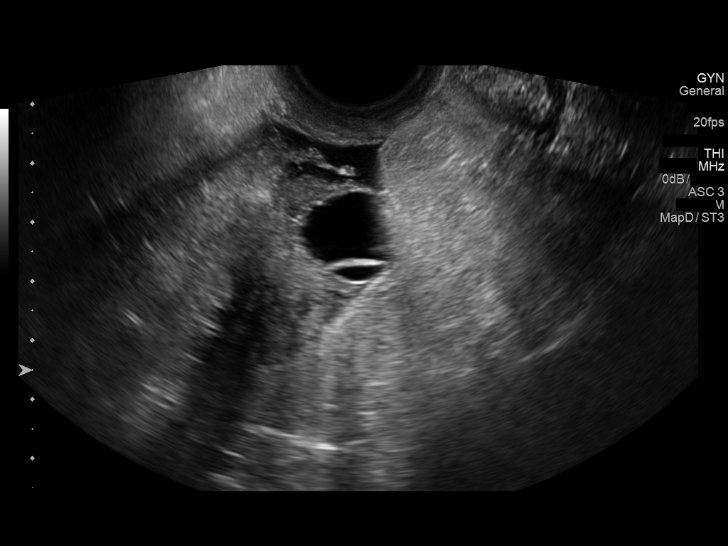
[im 52/84]
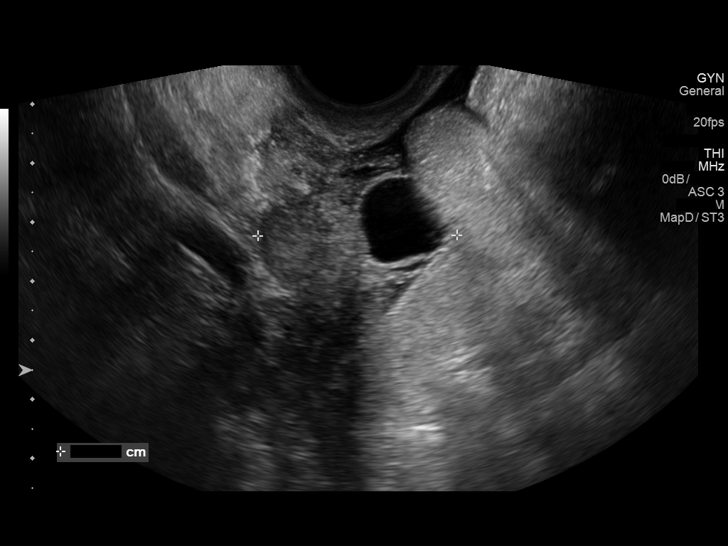
[im 56/84]
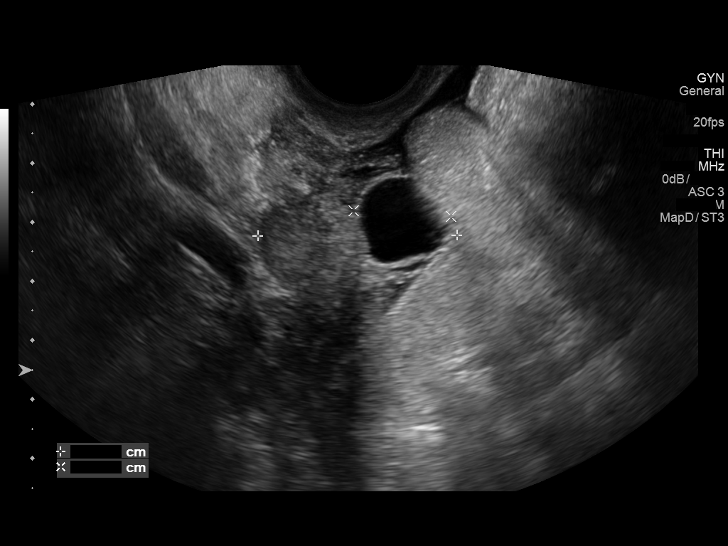
[im 63/84]
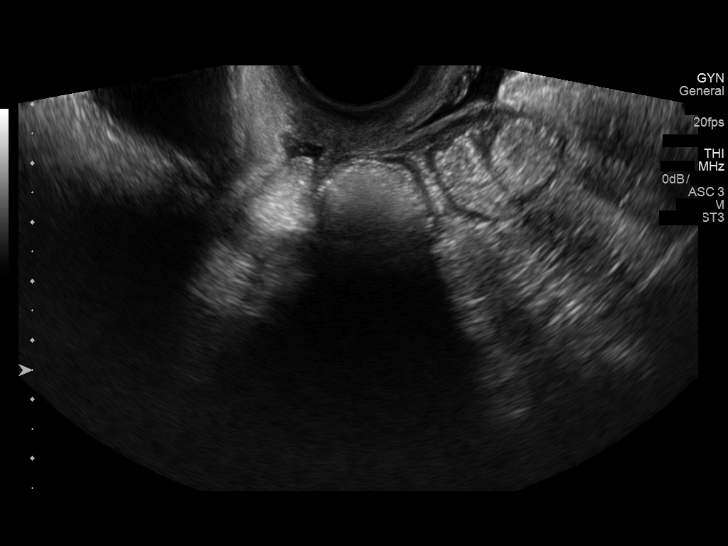
[im 70/84]
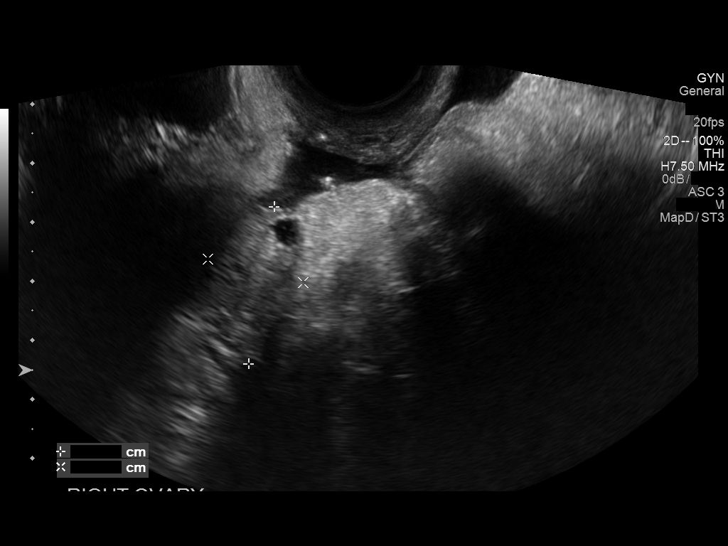
[im 77/84]
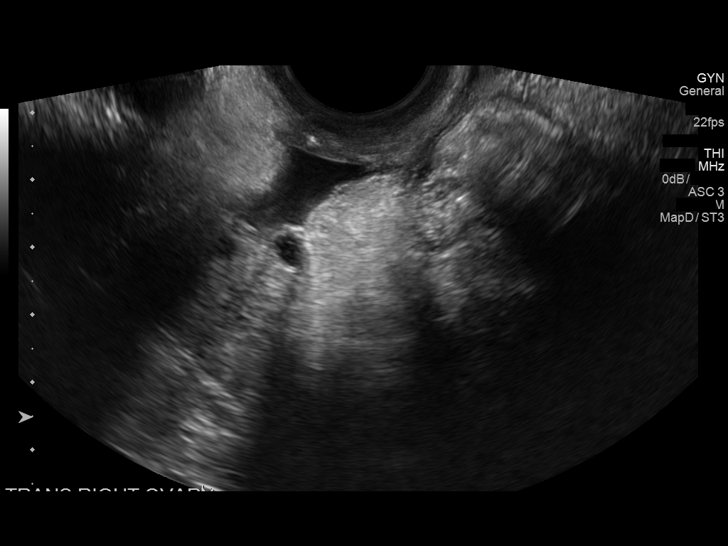
[im 84/84]
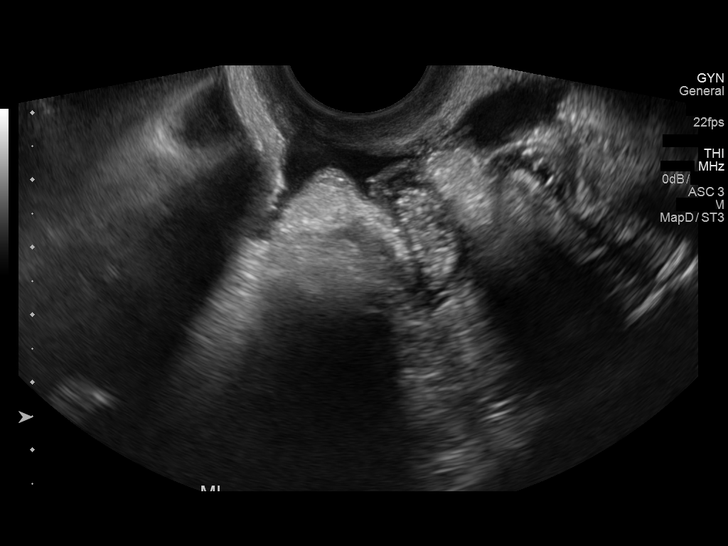

[14 of 25 positions shown; findings below may reference images not displayed]

FINDINGS: Uterus

Hysterectomy

Right ovary

Measurements: 2.7 x 1.7 x 2.4 cm. Normal appearance/no adnexal mass.

Left ovary

Measurements: 3.6 x 3.6 x 3.4 cm. 2.2 cm complex cyst, containing a
smooth septation.

Pulsed Doppler evaluation of both ovaries demonstrates normal
low-resistance arterial and venous waveforms.

Other findings

Small volume free pelvic fluid.
IMPRESSION: 2.2 cm mildly complex left adnexal cyst. Small volume free pelvic
fluid.

## 2017-02-17 ENCOUNTER — Telehealth: Payer: Self-pay | Admitting: *Deleted

## 2017-02-17 DIAGNOSIS — J301 Allergic rhinitis due to pollen: Secondary | ICD-10-CM | POA: Diagnosis not present

## 2017-02-17 DIAGNOSIS — J3089 Other allergic rhinitis: Secondary | ICD-10-CM | POA: Diagnosis not present

## 2017-02-17 DIAGNOSIS — J4520 Mild intermittent asthma, uncomplicated: Secondary | ICD-10-CM | POA: Diagnosis not present

## 2017-02-17 DIAGNOSIS — J3081 Allergic rhinitis due to animal (cat) (dog) hair and dander: Secondary | ICD-10-CM | POA: Diagnosis not present

## 2017-02-17 NOTE — Telephone Encounter (Signed)
Called patient to schedule for U/S that she cancelled in December. She would like to know what her upfront cost would be before scheduling.  Sending to to [place]. Patient aware that it will be precert and someone will call her regarding cost/scheduling -eh

## 2017-02-18 NOTE — Telephone Encounter (Signed)
Called patient to review benefits for a recommended ultrasound. Left Voicemail requesting a call back. °

## 2017-02-19 NOTE — Telephone Encounter (Signed)
Patient did return my call, however I was on another line and unable to speak with patient. Patient stated when I called back, it is ok to leave a detailed message regarding benefits.  I returned call and left benefits information on her voicemail as verbally authorized and requested by the patient. Patient is to call back to schedule appointment

## 2017-02-19 NOTE — Telephone Encounter (Signed)
Called patient to review benefits for a recommended ultrasound. Left Voicemail requesting a call back. °

## 2017-02-20 ENCOUNTER — Ambulatory Visit (INDEPENDENT_AMBULATORY_CARE_PROVIDER_SITE_OTHER): Payer: 59 | Admitting: Physician Assistant

## 2017-02-20 VITALS — BP 126/80 | HR 80 | Temp 97.8°F | Resp 18

## 2017-02-20 DIAGNOSIS — J4541 Moderate persistent asthma with (acute) exacerbation: Secondary | ICD-10-CM | POA: Diagnosis not present

## 2017-02-20 DIAGNOSIS — R0602 Shortness of breath: Secondary | ICD-10-CM | POA: Diagnosis not present

## 2017-02-20 MED ORDER — IPRATROPIUM BROMIDE 0.02 % IN SOLN: 0.5000 mg | Freq: Four times a day (QID) | RESPIRATORY_TRACT | 12 refills | Status: DC

## 2017-02-20 MED ORDER — IPRATROPIUM BROMIDE 0.02 % IN SOLN
0.5000 mg | Freq: Once | RESPIRATORY_TRACT | Status: AC
  Administered 2017-02-20: 0.5 mg via RESPIRATORY_TRACT

## 2017-02-20 MED ORDER — ALBUTEROL SULFATE (2.5 MG/3ML) 0.083% IN NEBU
2.5000 mg | INHALATION_SOLUTION | Freq: Once | RESPIRATORY_TRACT | Status: AC
  Administered 2017-02-20: 2.5 mg via RESPIRATORY_TRACT

## 2017-02-20 MED ORDER — METHYLPREDNISOLONE ACETATE 80 MG/ML IJ SUSP
80.0000 mg | Freq: Once | INTRAMUSCULAR | Status: AC
  Administered 2017-02-20: 80 mg via INTRAMUSCULAR

## 2017-02-20 NOTE — Progress Notes (Signed)
02/20/2017 3:02 PM   DOB: 1971/10/23 / MRN: [id number]  SUBJECTIVE:  Deborah Cordova is a 46 y.o. female presenting for SOB.  She has a history of moderate persistent asthma and tells me she went to the hospital three times last year for her asthma and that was a good year.  She has been intubated in the past for same.  Reports today's SOB started about 40 minutes ago.  She had tried taking 3 puff of her albuterol but this did not help, and she had to sit down due to SOB.  She denies chest pain, leg swelling, posterior calf pain, dizziness, nausea. She has no history of diabetes.    She is allergic to aspirin; cantaloupe (diagnostic); kiwi extract; nsaids; other; pineapple; and pyridium [phenazopyridine hcl].   She  has a past medical history of Asthma; COPD (chronic obstructive pulmonary disease) ([place]); Fibroid; Genital warts; Hypertension; PID (pelvic inflammatory disease); and Renal disorder.    She  reports that she has never smoked. She has never used smokeless tobacco. She reports that she drinks about 0.6 - 1.8 oz of alcohol per week . She reports that she does not use drugs. She  reports that she currently engages in sexual activity and has had female partners. She reports using the following method of birth control/protection: Surgical. The patient  has a past surgical history that includes Dilation and curettage of uterus; Colposcopy; laser genital warts; and Laparoscopic total hysterectomy.  Her family history includes Asthma in her brother, mother, and sister; Breast cancer in her sister; COPD in her father; Depression in her mother; Diabetes in her mother; Emphysema in her father; Hepatitis C in her father and mother; Hypertension in her mother.  Review of Systems  Respiratory: Positive for cough and wheezing. Negative for hemoptysis, sputum production and shortness of breath.     The problem list and medications were reviewed and updated by myself where necessary and exist elsewhere in the  encounter.   OBJECTIVE:  BP 126/80   Pulse 80   Temp 97.8 F (36.6 C) (Oral)   Resp 18   LMP 03/29/2016 (Approximate)   SpO2 97%   PF 410 L/min   Physical Exam  Constitutional: She is oriented to person, place, and time. She appears well-developed and well-nourished.  HENT:  Mouth/Throat: Oropharynx is clear and moist. No oropharyngeal exudate.  Cardiovascular: Normal rate and regular rhythm.   Pulmonary/Chest: Effort normal. She has wheezes (resolved with duonebs, peak flow from 300 to 410 with duonebs x 1 ).  Abdominal: Bowel sounds are normal.  Musculoskeletal: Normal range of motion. She exhibits no edema or tenderness.  Neurological: She is alert and oriented to person, place, and time.  Skin: Skin is warm and dry.  Psychiatric: She has a normal mood and affect.     PF Readings from Last 3 Encounters:  02/20/17 410 L/min    No results found for: HGBA1C   No results found for this or any previous visit (from the past 72 hour(s)).  No results found.  ASSESSMENT AND PLAN:  Diagnoses and all orders for this visit:  Shortness of breath: Given her history I felt it best to cover for a potentially worsening asthma flare with 80 depo.  I have am also getting her some ipratropium nebulizers for home.  If she is worse tomorrow will plan to start a 60, 60, 60, 40, 40, 40, 20, 20, 20 dose pack.  If severely worse she will need to come back or  go to the ED.  -     ipratropium (ATROVENT) nebulizer solution 0.5 mg; Take 2.5 mLs (0.5 mg total) by nebulization once. -     Recheck vitals -     albuterol (PROVENTIL) (2.5 MG/3ML) 0.083% nebulizer solution 2.5 mg; Take 3 mLs (2.5 mg total) by nebulization once.  Moderate persistent asthma with acute exacerbation -     methylPREDNISolone acetate (DEPO-MEDROL) injection 80 mg; Inject 1 mL (80 mg total) into the muscle once. -     ipratropium (ATROVENT) 0.02 % nebulizer solution; Take 2.5 mLs (0.5 mg total) by nebulization 4 (four)  times daily.    The patient is advised to call or return to clinic if she does not see an improvement in symptoms, or to seek the care of the closest emergency department if she worsens with the above plan.   Philis Fendt, MHS, PA-C [facility] 02/20/2017 3:02 PM

## 2017-03-16 ENCOUNTER — Ambulatory Visit (INDEPENDENT_AMBULATORY_CARE_PROVIDER_SITE_OTHER): Payer: 59

## 2017-03-16 ENCOUNTER — Ambulatory Visit (INDEPENDENT_AMBULATORY_CARE_PROVIDER_SITE_OTHER): Payer: 59 | Admitting: Family Medicine

## 2017-03-16 VITALS — BP 125/80 | HR 112 | Temp 98.8°F | Ht 68.75 in | Wt 212.6 lb

## 2017-03-16 DIAGNOSIS — R05 Cough: Secondary | ICD-10-CM

## 2017-03-16 DIAGNOSIS — R062 Wheezing: Secondary | ICD-10-CM

## 2017-03-16 DIAGNOSIS — J4541 Moderate persistent asthma with (acute) exacerbation: Secondary | ICD-10-CM | POA: Diagnosis not present

## 2017-03-16 DIAGNOSIS — R059 Cough, unspecified: Secondary | ICD-10-CM

## 2017-03-16 MED ORDER — METHYLPREDNISOLONE ACETATE 80 MG/ML IJ SUSP
80.0000 mg | Freq: Once | INTRAMUSCULAR | Status: AC
  Administered 2017-03-16: 80 mg via INTRAMUSCULAR

## 2017-03-16 MED ORDER — HYDROCODONE-HOMATROPINE 5-1.5 MG/5ML PO SYRP: 5.0000 mL | ORAL_SOLUTION | Freq: Three times a day (TID) | ORAL | 0 refills | Status: DC | PRN

## 2017-03-16 NOTE — Progress Notes (Signed)
Chief Complaint  Patient presents with  . Cough    X 2 days  . Chest Congestion    with yellow/bloody phlegm  . Wheezing    X 2- 3 day on left side    HPI   She reports that this morning she coughed and noticed that yellow-browish blood phlegm She reports that she felt like she was having pressure like someone was sitting on her chest She reports that this morning she took her usual inhalers and took her albuterol this morning before work but did not use the albuterol because she did not want her pulse to race.    Past Medical History:  Diagnosis Date  . Asthma   . COPD (chronic obstructive pulmonary disease) ([place])   . Fibroid   . Genital warts   . Hypertension   . PID (pelvic inflammatory disease)   . Renal disorder     Current Outpatient Prescriptions  Medication Sig Dispense Refill  . acetaminophen (TYLENOL) 500 MG tablet Take 1,000 mg by mouth every 6 (six) hours as needed for headache.    Marland Kitchen acyclovir (ZOVIRAX) 400 MG tablet Take 400 mg by mouth as needed (For fever blisters.).    Marland Kitchen albuterol (PROAIR HFA) 108 (90 Base) MCG/ACT inhaler Inhale 2 puffs into the lungs every 4 (four) hours as needed for wheezing or shortness of breath. 1 Inhaler 1  . amLODipine (NORVASC) 5 MG tablet Take 5 mg by mouth daily.    Marland Kitchen azithromycin (ZITHROMAX) 250 MG tablet Take 2 tabs PO x 1 dose, then 1 tab PO QD x 4 days 6 tablet 0  . budesonide-formoterol (SYMBICORT) 160-4.5 MCG/ACT inhaler Take 2 puffs first thing in am and then another 2 puffs about 12 hours later. 1 Inhaler 11  . butalbital-acetaminophen-caffeine (FIORICET, ESGIC) 50-325-40 MG tablet Take 0.5 tablets by mouth 2 (two) times daily as needed for headache.    . cetirizine (ZYRTEC ALLERGY) 10 MG tablet Take 10 mg by mouth daily.    . fluticasone furoate-vilanterol (BREO ELLIPTA) 100-25 MCG/INH AEPB Inhale 1 puff into the lungs daily. 30 each 11  . gabapentin (NEURONTIN) 300 MG capsule Take 300 mg by mouth daily as needed  (restless leg).    Marland Kitchen ipratropium (ATROVENT) 0.02 % nebulizer solution Take 2.5 mLs (0.5 mg total) by nebulization 4 (four) times daily. 75 mL 12  . ranitidine (ZANTAC) 150 MG tablet Take 150 mg by mouth 2 (two) times daily.    Marland Kitchen azelastine (OPTIVAR) 0.05 % ophthalmic solution   3  . chlorpheniramine-HYDROcodone (TUSSIONEX PENNKINETIC ER) 10-8 MG/5ML SUER Take 5 mLs by mouth every 12 (twelve) hours as needed for cough. (Patient not taking: Reported on 03/16/2017) 100 mL 0  . doxycycline (VIBRA-TABS) 100 MG tablet Take 1 tablet (100 mg total) by mouth 2 (two) times daily. 20 tablet 0  . fluconazole (DIFLUCAN) 150 MG tablet Take 1 tablet (150 mg total) by mouth once. 2 tablet 0  . HYDROcodone-homatropine (HYCODAN) 5-1.5 MG/5ML syrup Take 5 mLs by mouth every 8 (eight) hours as needed for cough. 120 mL 0  . montelukast (SINGULAIR) 10 MG tablet   1  . ondansetron (ZOFRAN) 4 MG tablet Take 1 tablet (4 mg total) by mouth every 8 (eight) hours as needed for nausea or vomiting. (Patient not taking: Reported on 03/16/2017) 12 tablet 0   No current facility-administered medications for this visit.     Allergies:  Allergies  Allergen Reactions  . Aspirin Other (See Comments)  Asthma flare up - wheezing  . Cantaloupe (Diagnostic) Itching and Other (See Comments)    Makes tongue itch  . Kiwi Extract Itching and Other (See Comments)    Makes tongue itch  . Nsaids Other (See Comments)    Asthma flare up - wheezing   . Other Other (See Comments)    Pecans and walnuts---induce asthma attack   . Pineapple Other (See Comments)    Tongue bleeds  . Pyridium [Phenazopyridine Hcl] Itching    Itching, swelling and burning    Past Surgical History:  Procedure Laterality Date  . COLPOSCOPY    . DILATION AND CURETTAGE OF UTERUS    . LAPAROSCOPIC TOTAL HYSTERECTOMY    . laser genital warts      Social History   Social History  . Marital status: Married    Spouse name: N/A  . Number of children: N/A   . Years of education: N/A   Social History Main Topics  . Smoking status: Never Smoker  . Smokeless tobacco: Never Used  . Alcohol use 0.6 - 1.8 oz/week    1 - 3 Glasses of wine per week  . Drug use: No  . Sexual activity: Yes    Partners: Male    Birth control/ protection: Surgical   Other Topics Concern  . None   Social History Narrative  . None    ROS See hpi  Objective: Vitals:   03/16/17 1829  BP: 125/80  Pulse: (!) 112  Temp: 98.8 F (37.1 C)  TempSrc: Oral  SpO2: 98%  Weight: 212 lb 9.6 oz (96.4 kg)  Height: 5' 8.75" (1.746 m)    Physical Exam  General: alert, oriented, in NAD Head: normocephalic, atraumatic, no sinus tenderness Eyes: EOM intact, no scleral icterus or conjunctival injection Ears: TM clear bilaterally Nose: mucosa nonerythematous, nonedematous Throat: no pharyngeal exudate or erythema Lymph: no posterior auricular, submental or cervical lymph adenopathy Heart: normal rate, normal sinus rhythm, no murmurs Lungs: end expiratory wheezing in the left lower lobe with anterior right upper lobe wheezing    CLINICAL DATA:  Wheezing and cough  EXAM: CHEST  2 VIEW  COMPARISON:  10/05/2016  FINDINGS: The heart size and mediastinal contours are within normal limits. Both lungs are hyperinflated. The visualized skeletal structures are unremarkable.  IMPRESSION: No acute abnormality noted. Hyperinflation may be related to a vigorous inspiratory effort.   Electronically Signed   By: Inez Catalina M.D.   On: 03/16/2017 18:40  Assessment and Plan Deborah Cordova was seen today for cough, chest congestion and wheezing.  Diagnoses and all orders for this visit:  Cough Wheezing Xray negative for pneumonia  -     DG Chest 2 View  Moderate persistent asthma with (acute) exacerbation- will treat with doxycycline due to purulent sputum and severity of symptoms -     methylPREDNISolone acetate (DEPO-MEDROL) injection 80 mg; Inject 1 mL (80  mg total) into the muscle once. -     HYDROcodone-homatropine (HYCODAN) 5-1.5 MG/5ML syrup; Take 5 mLs by mouth every 8 (eight) hours as needed for cough. -     doxycycline (VIBRA-TABS) 100 MG tablet; Take 1 tablet (100 mg total) by mouth 2 (two) times daily. -     fluconazole (DIFLUCAN) 150 MG tablet; Take 1 tablet (150 mg total) by mouth once.     [place]

## 2017-03-16 NOTE — Patient Instructions (Addendum)
   IF you received an x-ray today, you will receive an invoice from [facility]. Please contact [facility] at [phone] with questions or concerns regarding your invoice.   IF you received labwork today, you will receive an invoice from [facility]. Please contact [facility] at [phone] with questions or concerns regarding your invoice.   Our billing staff will not be able to assist you with questions regarding bills from these companies.  You will be contacted with the lab results as soon as they are available. The fastest way to get your results is to activate your My Chart account. Instructions are located on the last page of this paperwork. If you have not heard from us regarding the results in 2 weeks, please contact this office.     Asthma, Adult Asthma is a condition of the lungs in which the airways tighten and narrow. Asthma can make it hard to breathe. Asthma cannot be cured, but medicine and lifestyle changes can help control it. Asthma may be started (triggered) by:  Animal skin flakes (dander).  Dust.  Cockroaches.  Pollen.  Mold.  Smoke.  Cleaning products.  Hair sprays or aerosol sprays.  Paint fumes or strong smells.  Cold air, weather changes, and winds.  Crying or laughing hard.  Stress.  Certain medicines or drugs.  Foods, such as dried fruit, potato chips, and sparkling grape juice.  Infections or conditions (colds, flu).  Exercise.  Certain medical conditions or diseases.  Exercise or tiring activities.  Follow these instructions at home:  Take medicine as told by your doctor.  Use a peak flow meter as told by your doctor. A peak flow meter is a tool that measures how well the lungs are working.  Record and keep track of the peak flow meter's readings.  Understand and use the asthma action plan. An asthma action plan is a written plan for taking care of your asthma and treating your attacks.  To help prevent  asthma attacks: ? Do not smoke. Stay away from secondhand smoke. ? Change your heating and air conditioning filter often. ? Limit your use of fireplaces and wood stoves. ? Get rid of pests (such as roaches and mice) and their droppings. ? Throw away plants if you see mold on them. ? Clean your floors. Dust regularly. Use cleaning products that do not smell. ? Have someone vacuum when you are not home. Use a vacuum cleaner with a HEPA filter if possible. ? Replace carpet with wood, tile, or vinyl flooring. Carpet can trap animal skin flakes and dust. ? Use allergy-proof pillows, mattress covers, and box spring covers. ? Wash bed sheets and blankets every week in hot water and dry them in a dryer. ? Use blankets that are made of polyester or cotton. ? Clean bathrooms and kitchens with bleach. If possible, have someone repaint the walls in these rooms with mold-resistant paint. Keep out of the rooms that are being cleaned and painted. ? Wash hands often. Contact a doctor if:  You have make a whistling sound when breaking (wheeze), have shortness of breath, or have a cough even if taking medicine to prevent attacks.  The colored mucus you cough up (sputum) is thicker than usual.  The colored mucus you cough up changes from clear or white to yellow, green, gray, or bloody.  You have problems from the medicine you are taking such as: ? A rash. ? Itching. ? Swelling. ? Trouble breathing.  You need reliever medicines more than   2-3 times a week.  Your peak flow measurement is still at 50-79% of your personal best after following the action plan for 1 hour.  You have a fever. Get help right away if:  You seem to be worse and are not responding to medicine during an asthma attack.  You are short of breath even at rest.  You get short of breath when doing very little activity.  You have trouble eating, drinking, or talking.  You have chest pain.  You have a fast heartbeat.  Your  lips or fingernails start to turn blue.  You are light-headed, dizzy, or faint.  Your peak flow is less than 50% of your personal best. This information is not intended to replace advice given to you by your health care provider. Make sure you discuss any questions you have with your health care provider. Document Released: 06/02/2008 Document Revised: 05/22/2016 Document Reviewed: 07/14/2013 Elsevier Interactive Patient Education  2017 Elsevier Inc.  

## 2017-03-20 MED ORDER — DOXYCYCLINE HYCLATE 100 MG PO TABS: 100.0000 mg | ORAL_TABLET | Freq: Two times a day (BID) | ORAL | 0 refills | Status: DC

## 2017-03-20 MED ORDER — FLUCONAZOLE 150 MG PO TABS
150.0000 mg | ORAL_TABLET | Freq: Once | ORAL | 0 refills | Status: AC
Start: 2017-03-20 — End: 2017-03-20

## 2017-04-03 ENCOUNTER — Telehealth: Payer: Self-pay | Admitting: Family Medicine

## 2017-04-03 NOTE — Telephone Encounter (Signed)
Pt calling needing a inhaler for her asthma she is out of town and need it as soon as possible the pharmacy is [facility]  On [address] in [place] phone number [phone]

## 2017-04-03 NOTE — Telephone Encounter (Signed)
Albuterol inhaler script called in to [facility] in [place]

## 2017-05-07 ENCOUNTER — Encounter: Payer: Self-pay | Admitting: Family Medicine

## 2017-05-07 ENCOUNTER — Ambulatory Visit (INDEPENDENT_AMBULATORY_CARE_PROVIDER_SITE_OTHER): Payer: 59 | Admitting: Family Medicine

## 2017-05-07 VITALS — BP 129/75 | HR 94 | Temp 98.5°F | Resp 18 | Ht 68.75 in | Wt 200.6 lb

## 2017-05-07 DIAGNOSIS — R0981 Nasal congestion: Secondary | ICD-10-CM | POA: Diagnosis not present

## 2017-05-07 DIAGNOSIS — J4541 Moderate persistent asthma with (acute) exacerbation: Secondary | ICD-10-CM | POA: Diagnosis not present

## 2017-05-07 MED ORDER — IPRATROPIUM BROMIDE 0.03 % NA SOLN: 2.0000 | Freq: Two times a day (BID) | NASAL | 0 refills | Status: DC

## 2017-05-07 MED ORDER — PREDNISONE 20 MG PO TABS: ORAL_TABLET | ORAL | 0 refills | Status: DC

## 2017-05-07 MED ORDER — IPRATROPIUM BROMIDE 0.02 % IN SOLN: 0.5000 mg | Freq: Four times a day (QID) | RESPIRATORY_TRACT | 1 refills | Status: DC

## 2017-05-07 MED ORDER — IPRATROPIUM BROMIDE 0.02 % IN SOLN
0.5000 mg | Freq: Once | RESPIRATORY_TRACT | Status: AC
  Administered 2017-05-07: 0.5 mg via RESPIRATORY_TRACT

## 2017-05-07 MED ORDER — ALBUTEROL SULFATE (2.5 MG/3ML) 0.083% IN NEBU
2.5000 mg | INHALATION_SOLUTION | Freq: Once | RESPIRATORY_TRACT | Status: AC
  Administered 2017-05-07: 2.5 mg via RESPIRATORY_TRACT

## 2017-05-07 MED ORDER — METHYLPREDNISOLONE SODIUM SUCC 125 MG IJ SOLR
125.0000 mg | Freq: Once | INTRAMUSCULAR | Status: AC
  Administered 2017-05-07: 125 mg via INTRAVENOUS

## 2017-05-07 NOTE — Progress Notes (Signed)
Patient ID: Rhena Glace, female    DOB: 03-25-1971  Age: 46 y.o. MRN: [id number]  Chief Complaint  Patient presents with  . Wheezing    Subjective:   Patient is been getting tighter with her breathing all day. She has been wheezing. She has a history of asthma and periodic exacerbations. She is on Singulair and inhaler therapies. She does not smoke. She has had to be hospitalized in the past, and it is been about 20 years ago she was intubated for this. She is not febrile. She has a lot of trouble with nasal not stuffing her nose up at night.  Current allergies, medications, problem list, past/family and social histories reviewed.  Objective:  BP 129/75   Pulse 94   Temp 98.5 F (36.9 C) (Oral)   Resp 18   Ht 5' 8.75" (1.746 m)   Wt 200 lb 9.6 oz (91 kg)   LMP 03/29/2016 (Approximate)   SpO2 98%   PF 230 L/min   BMI 29.84 kg/m   Alert and oriented. Breathing is tight. Her peak flow was 2:30 with a predicted maximum of about 4 and 50. Neck supple without significant nodes. Chest has soft diminished breath sounds with soft wheezing bilaterally diffusely. Heart regular without murmur.  Assessment & Plan:   Assessment: 1. Moderate persistent asthma with acute exacerbation   2. Nasal congestion       Plan: Will try and give her some steroids to see if we can break the cycle.  No orders of the defined types were placed in this encounter.   Meds ordered this encounter  Medications  . ipratropium (ATROVENT) 0.03 % nasal spray    Sig: Place 2 sprays into both nostrils 2 (two) times daily.    Dispense:  30 mL    Refill:  0  . albuterol (PROVENTIL) (2.5 MG/3ML) 0.083% nebulizer solution 2.5 mg  . ipratropium (ATROVENT) nebulizer solution 0.5 mg  . methylPREDNISolone sodium succinate (SOLU-MEDROL) 125 mg/2 mL injection 125 mg  . predniSONE (DELTASONE) 20 MG tablet    Sig: Take 3 daily for 2 days, then 2 daily for 2 days, then 1 daily for 2 days for asthma    Dispense:  12  tablet    Refill:  0         Patient Instructions   Try using the Atrovent nasal spray 2 sprays twice daily if needed for nasal congestion  Drink plenty of fluids to stay well hydrated  Continue using your regular asthma medications  If you get abruptly worse go to the emergency room or call 911 if needed  It wouldn't hurt to try using some nasal saline to loosen up your nose congestion also.      IF you received an x-ray today, you will receive an invoice from [facility]. Please contact [facility] at [phone] with questions or concerns regarding your invoice.   IF you received labwork today, you will receive an invoice from [facility]. Please contact [facility] at [phone] with questions or concerns regarding your invoice.   Our billing staff will not be able to assist you with questions regarding bills from these companies.  You will be contacted with the lab results as soon as they are available. The fastest way to get your results is to activate your My Chart account. Instructions are located on the last page of this paperwork. If you have not heard from [place] regarding the results in 2 weeks, please contact this office.  Return if symptoms worsen or fail to improve.   HOPPER,DAVID, MD 05/07/2017

## 2017-05-07 NOTE — Patient Instructions (Addendum)
Try using the Atrovent nasal spray 2 sprays twice daily if needed for nasal congestion  Drink plenty of fluids to stay well hydrated  Continue using your regular asthma medications  If you get abruptly worse go to the emergency room or call 911 if needed  It wouldn't hurt to try using some nasal saline to loosen up your nose congestion also.      IF you received an x-ray today, you will receive an invoice from [facility]. Please contact [facility] at [phone] with questions or concerns regarding your invoice.   IF you received labwork today, you will receive an invoice from [facility]. Please contact [facility] at [phone] with questions or concerns regarding your invoice.   Our billing staff will not be able to assist you with questions regarding bills from these companies.  You will be contacted with the lab results as soon as they are available. The fastest way to get your results is to activate your My Chart account. Instructions are located on the last page of this paperwork. If you have not heard from [place] regarding the results in 2 weeks, please contact this office.

## 2017-05-08 MED FILL — IPRATROPIUM BR 0.02% SOLN: 0.02 | 7 days supply | Qty: 75 | Fill #0

## 2017-05-08 MED FILL — predniSONE 20 MG TABS: 20 | 6 days supply | Qty: 12 | Fill #0

## 2017-05-08 MED FILL — IPRATROPIUM 0.03% SPRAY: 0.03 | 43 days supply | Qty: 30 | Fill #0

## 2017-05-15 ENCOUNTER — Ambulatory Visit: Payer: 59 | Admitting: Family Medicine

## 2017-05-15 MED FILL — VENTOLIN HFA 90 MCG INHALER: 108 (90 BAS | 75 days supply | Qty: 54 | Fill #1 | Status: TO

## 2017-05-15 MED FILL — IPRATROPIUM BR 0.02% SOLN: 0.02 | 7 days supply | Qty: 75 | Fill #0

## 2017-05-15 MED FILL — raNITIdine HCL 150 MG TABS: 150 | 90 days supply | Qty: 180 | Fill #1

## 2017-05-15 MED FILL — AMLODIPINE BESYLATE 5 MG TA: 5 | 90 days supply | Qty: 90 | Fill #1 | Status: TO

## 2017-05-15 MED FILL — MONTELUKAST SOD 10 MG TAB: 10 | 90 days supply | Qty: 90 | Fill #1

## 2017-05-26 ENCOUNTER — Encounter: Payer: Self-pay | Admitting: Obstetrics and Gynecology

## 2017-06-04 MED FILL — NYSTATIN 100,000 UNITS/GM O: 100000 | 10 days supply | Qty: 15 | Fill #0

## 2017-06-17 ENCOUNTER — Ambulatory Visit (INDEPENDENT_AMBULATORY_CARE_PROVIDER_SITE_OTHER): Payer: 59 | Admitting: Family Medicine

## 2017-06-17 ENCOUNTER — Encounter: Payer: Self-pay | Admitting: Family Medicine

## 2017-06-17 ENCOUNTER — Telehealth: Payer: Self-pay | Admitting: Obstetrics and Gynecology

## 2017-06-17 DIAGNOSIS — J4541 Moderate persistent asthma with (acute) exacerbation: Secondary | ICD-10-CM

## 2017-06-17 DIAGNOSIS — R102 Pelvic and perineal pain: Secondary | ICD-10-CM

## 2017-06-17 MED ORDER — ALBUTEROL SULFATE (2.5 MG/3ML) 0.083% IN NEBU
2.5000 mg | INHALATION_SOLUTION | Freq: Once | RESPIRATORY_TRACT | Status: AC
  Administered 2017-06-17: 2.5 mg via RESPIRATORY_TRACT

## 2017-06-17 MED ORDER — BUDESONIDE-FORMOTEROL FUMARATE 160-4.5 MCG/ACT IN AERO: INHALATION_SPRAY | RESPIRATORY_TRACT | 3 refills | Status: DC

## 2017-06-17 MED ORDER — PREDNISONE 20 MG PO TABS: ORAL_TABLET | ORAL | 0 refills | Status: DC

## 2017-06-17 MED ORDER — METHYLPREDNISOLONE SODIUM SUCC 125 MG IJ SOLR
125.0000 mg | Freq: Once | INTRAMUSCULAR | Status: AC
  Administered 2017-06-17: 125 mg via INTRAMUSCULAR

## 2017-06-17 MED ORDER — IPRATROPIUM BROMIDE 0.02 % IN SOLN
0.5000 mg | Freq: Once | RESPIRATORY_TRACT | Status: AC
  Administered 2017-06-17: 0.5 mg via RESPIRATORY_TRACT

## 2017-06-17 MED FILL — SYMBICORT 160-4.5 MCG INH: 160-4.5 | 30 days supply | Qty: 10 | Fill #0

## 2017-06-17 MED FILL — predniSONE 20 MG TABS: 20 | 6 days supply | Qty: 12 | Fill #0

## 2017-06-17 NOTE — Telephone Encounter (Signed)
Patient wants to schedule an ultrasound appointment.

## 2017-06-17 NOTE — Progress Notes (Signed)
Chief Complaint  Patient presents with  . Wheezing    X 1 week    HPI  Pt reports that for the past week she has been very short of breath and wheezing She reports that she has been using her nebulizer once daily and her albuterol inhaler bid Per her chart it says she was prescribed symbicort but is not taking it Her only medications are albuterol, zyrtec and singulair She states that she has been coughing if she laughs but other wise she feels like she can't catch her breath.    Past Medical History:  Diagnosis Date  . Asthma   . COPD (chronic obstructive pulmonary disease) ([place])   . Fibroid   . Genital warts   . Hypertension   . PID (pelvic inflammatory disease)   . Renal disorder     Current Outpatient Prescriptions  Medication Sig Dispense Refill  . acyclovir (ZOVIRAX) 400 MG tablet Take 400 mg by mouth as needed (For fever blisters.).    Marland Kitchen albuterol (PROAIR HFA) 108 (90 Base) MCG/ACT inhaler Inhale 2 puffs into the lungs every 4 (four) hours as needed for wheezing or shortness of breath. 1 Inhaler 1  . amLODipine (NORVASC) 5 MG tablet Take 5 mg by mouth daily.    . butalbital-acetaminophen-caffeine (FIORICET, ESGIC) 50-325-40 MG tablet Take 0.5 tablets by mouth 2 (two) times daily as needed for headache.    . cetirizine (ZYRTEC ALLERGY) 10 MG tablet Take 10 mg by mouth daily.    . chlorpheniramine-HYDROcodone (TUSSIONEX PENNKINETIC ER) 10-8 MG/5ML SUER Take 5 mLs by mouth every 12 (twelve) hours as needed for cough. 100 mL 0  . fluticasone furoate-vilanterol (BREO ELLIPTA) 100-25 MCG/INH AEPB Inhale 1 puff into the lungs daily. 30 each 11  . gabapentin (NEURONTIN) 300 MG capsule Take 300 mg by mouth daily as needed (restless leg).    Marland Kitchen ipratropium (ATROVENT) 0.02 % nebulizer solution Take 2.5 mLs (0.5 mg total) by nebulization 4 (four) times daily. 75 mL 1  . ipratropium (ATROVENT) 0.03 % nasal spray Place 2 sprays into both nostrils 2 (two) times daily. 30 mL 0  .  montelukast (SINGULAIR) 10 MG tablet   1  . ranitidine (ZANTAC) 150 MG tablet Take 150 mg by mouth 2 (two) times daily.    Marland Kitchen acetaminophen (TYLENOL) 500 MG tablet Take 1,000 mg by mouth every 6 (six) hours as needed for headache.    Marland Kitchen azelastine (OPTIVAR) 0.05 % ophthalmic solution   3  . budesonide-formoterol (SYMBICORT) 160-4.5 MCG/ACT inhaler Take 2 puffs first thing in am and then another 2 puffs about 12 hours later. 1 Inhaler 3  . predniSONE (DELTASONE) 20 MG tablet Take 3 daily for 2 days, then 2 daily for 2 days, then 1 daily for 2 days for asthma 12 tablet 0   No current facility-administered medications for this visit.     Allergies:  Allergies  Allergen Reactions  . Aspirin Other (See Comments)    Asthma flare up - wheezing  . Cantaloupe (Diagnostic) Itching and Other (See Comments)    Makes tongue itch  . Kiwi Extract Itching and Other (See Comments)    Makes tongue itch  . Nsaids Other (See Comments)    Asthma flare up - wheezing   . Other Other (See Comments)    Pecans and walnuts---induce asthma attack   . Pineapple Other (See Comments)    Tongue bleeds  . Pyridium [Phenazopyridine Hcl] Itching    Itching, swelling and burning  Past Surgical History:  Procedure Laterality Date  . COLPOSCOPY    . DILATION AND CURETTAGE OF UTERUS    . LAPAROSCOPIC TOTAL HYSTERECTOMY    . laser genital warts      Social History   Social History  . Marital status: Married    Spouse name: N/A  . Number of children: N/A  . Years of education: N/A   Social History Main Topics  . Smoking status: Never Smoker  . Smokeless tobacco: Never Used  . Alcohol use 0.6 - 1.8 oz/week    1 - 3 Glasses of wine per week  . Drug use: No  . Sexual activity: Yes    Partners: Male    Birth control/ protection: Surgical   Other Topics Concern  . None   Social History Narrative  . None    ROS  Objective: Vitals:   06/17/17 1230  BP: 122/79  Pulse: 91  Resp: 18  Temp: 97.8  F (36.6 C)  TempSrc: Oral  SpO2: 98%  Weight: 197 lb 6.4 oz (89.5 kg)  Height: 5' 8.75" (1.746 m)    Physical Exam General: alert, oriented, in NAD Head: normocephalic, atraumatic, no sinus tenderness Eyes: EOM intact, no scleral icterus or conjunctival injection Ears: TM clear bilaterally Nose: mucosa nonerythematous, nonedematous Throat: no pharyngeal exudate or erythema Lymph: no posterior auricular, submental or cervical lymph adenopathy Heart: normal rate, normal sinus rhythm, no murmurs Lungs pre-nebulizer: poor air movement, use of accessory muscles, no retractions Lungs post-nebulizer: improved air movement, diffuse end expiratory wheeze, no crackles, no ronchi   Assessment and Plan Deborah Cordova was seen today for wheezing.  Diagnoses and all orders for this visit:  Moderate persistent asthma with acute exacerbation- pt improved after nebulizer Advised to use her albuterol q4 Gave ER precautions Gave solumedrol and duoneb Discussed that she she start symbicort for adequate control -     predniSONE (DELTASONE) 20 MG tablet; Take 3 daily for 2 days, then 2 daily for 2 days, then 1 daily for 2 days for asthma -     budesonide-formoterol (SYMBICORT) 160-4.5 MCG/ACT inhaler; Take 2 puffs first thing in am and then another 2 puffs about 12 hours later. -     albuterol (PROVENTIL) (2.5 MG/3ML) 0.083% nebulizer solution 2.5 mg; Take 3 mLs (2.5 mg total) by nebulization once. -     ipratropium (ATROVENT) nebulizer solution 0.5 mg; Take 2.5 mLs (0.5 mg total) by nebulization once. -     methylPREDNISolone sodium succinate (SOLU-MEDROL) 125 mg/2 mL injection 125 mg; Inject 2 mLs (125 mg total) into the muscle once.  A total of 25 minutes were spent face-to-face with the patient during this encounter and over half of that time was spent on counseling and coordination of care.    [place]

## 2017-06-17 NOTE — Telephone Encounter (Signed)
Spoke with patient. Patient states that she was scheduled for PUS with Dr.Jertson earlier this yeah and had to cancel due to benefits. Patient is still having left sided discomfort and bladder/vaginal pressure. Would like benefits checked for PUS again so that she can proceed with scheduling.  Routing to [employer] for [employer]

## 2017-06-17 NOTE — Telephone Encounter (Signed)
Returned call to patient. Spoke with patient regarding benefit for ultrasound. Patient understood and agreeable. Patient ready to schedule. Patient scheduled 06/23/17 with Dr Talbert Nan. Patient aware of date, arrival time and cancellation policy. Patient had no further questions.   Routing to Dr Talbert Nan for final review  c: Reesa Chew, RN

## 2017-06-17 NOTE — Patient Instructions (Addendum)
   IF you received an x-ray today, you will receive an invoice from [facility]. Please contact [facility] at [phone] with questions or concerns regarding your invoice.   IF you received labwork today, you will receive an invoice from [facility]. Please contact [facility] at [phone] with questions or concerns regarding your invoice.   Our billing staff will not be able to assist you with questions regarding bills from these companies.  You will be contacted with the lab results as soon as they are available. The fastest way to get your results is to activate your My Chart account. Instructions are located on the last page of this paperwork. If you have not heard from us regarding the results in 2 weeks, please contact this office.     Asthma Attack Prevention, Adult Although you may not be able to control the fact that you have asthma, you can take actions to prevent episodes of asthma (asthma attacks). These actions include:  Creating a written plan for managing and treating your asthma attacks (asthma action plan).  Monitoring your asthma.  Avoiding things that can irritate your airways or make your asthma symptoms worse (asthma triggers).  Taking your medicines as directed.  Acting quickly if you have signs or symptoms of an asthma attack.  What are some ways to prevent an asthma attack? Create a plan Work with your health care provider to create an asthma action plan. This plan should include:  A list of your asthma triggers and how to avoid them.  A list of symptoms that you experience during an asthma attack.  Information about when to take medicine and how much medicine to take.  Information to help you understand your peak flow measurements.  Contact information for your health care providers.  Daily actions that you can take to control asthma.  Monitor your asthma  To monitor your asthma:  Use your peak flow meter every morning and  every evening for 2-3 weeks. Record the results in a journal. A drop in your peak flow numbers on one or more days may mean that you are starting to have an asthma attack, even if you are not having symptoms.  When you have asthma symptoms, write them down in a journal.  Avoid asthma triggers  Work with your health care provider to find out what your asthma triggers are. This can be done by:  Being tested for allergies.  Keeping a journal that notes when asthma attacks occur and what may have contributed to them.  Asking your health care provider whether other medical conditions make your asthma worse.  Common asthma triggers include:  Dust.  Smoke. This includes campfire smoke and secondhand smoke from tobacco products.  Pet dander.  Trees, grasses or pollens.  Very cold, dry, or humid air.  Mold.  Foods that contain high amounts of sulfites.  Strong smells.  Engine exhaust and air pollution.  Aerosol sprays and fumes from household cleaners.  Household pests and their droppings, including dust mites and cockroaches.  Certain medicines, including NSAIDs.  Once you have determined your asthma triggers, take steps to avoid them. Depending on your triggers, you may be able to reduce the chance of an asthma attack by:  Keeping your home clean. Have someone dust and vacuum your home for you 1 or 2 times a week. If possible, have them use a high-efficiency particulate arrestance (HEPA) vacuum.  Washing your sheets weekly in hot water.  Using allergy-proof mattress covers and casings   on your bed.  Keeping pets out of your home.  Taking care of mold and water problems in your home.  Avoiding areas where people smoke.  Avoiding using strong perfumes or odor sprays.  Avoid spending a lot of time outdoors when pollen counts are high and on very windy days.  Talking with your health care provider before stopping or starting any new medicines.  Medicines Take  over-the-counter and prescription medicines only as told by your health care provider. Many asthma attacks can be prevented by carefully following your medicine schedule. Taking your medicines correctly is especially important when you cannot avoid certain asthma triggers. Even if you are doing well, do not stop taking your medicine and do not take less medicine. Act quickly If an asthma attack happens, acting quickly can decrease how severe it is and how long it lasts. Take these actions:  Pay attention to your symptoms. If you are coughing, wheezing, or having difficulty breathing, do not wait to see if your symptoms go away on their own. Follow your asthma action plan.  If you have followed your asthma action plan and your symptoms are not improving, call your health care provider or seek immediate medical care at the nearest hospital.  It is important to write down how often you need to use your fast-acting rescue inhaler. You can track how often you use an inhaler in your journal. If you are using your rescue inhaler more often, it may mean that your asthma is not under control. Adjusting your asthma treatment plan may help you to prevent future asthma attacks and help you to gain better control of your condition. How can I prevent an asthma attack when I exercise?  Exercise is a common asthma trigger. To prevent asthma attacks during exercise:  Follow advice from your health care provider about whether you should use your fast-acting inhaler before exercising. Many people with asthma experience exercise-induced bronchoconstriction (EIB). This condition often worsens during vigorous exercise in cold, humid, or dry environments. Usually, people with EIB can stay very active by using a fast-acting inhaler before exercising.  Avoid exercising outdoors in very cold or humid weather.  Avoid exercising outdoors when pollen counts are high.  Warm up and cool down when exercising.  Stop exercising  right away if asthma symptoms start.  Consider taking part in exercises that are less likely to cause asthma symptoms such as:  Indoor swimming.  Biking.  Walking.  Hiking.  Playing football.  This information is not intended to replace advice given to you by your health care provider. Make sure you discuss any questions you have with your health care provider. Document Released: 12/03/2009 Document Revised: 08/15/2016 Document Reviewed: 05/31/2016 Elsevier Interactive Patient Education  2017 Elsevier Inc.  

## 2017-06-23 ENCOUNTER — Ambulatory Visit (INDEPENDENT_AMBULATORY_CARE_PROVIDER_SITE_OTHER): Payer: 59 | Admitting: Obstetrics and Gynecology

## 2017-06-23 ENCOUNTER — Ambulatory Visit (INDEPENDENT_AMBULATORY_CARE_PROVIDER_SITE_OTHER): Payer: 59

## 2017-06-23 ENCOUNTER — Encounter: Payer: Self-pay | Admitting: Obstetrics and Gynecology

## 2017-06-23 VITALS — BP 110/70 | HR 70 | Resp 16 | Ht 68.75 in | Wt 200.0 lb

## 2017-06-23 DIAGNOSIS — B373 Candidiasis of vulva and vagina: Secondary | ICD-10-CM

## 2017-06-23 DIAGNOSIS — R102 Pelvic and perineal pain: Secondary | ICD-10-CM | POA: Diagnosis not present

## 2017-06-23 DIAGNOSIS — B3731 Acute candidiasis of vulva and vagina: Secondary | ICD-10-CM

## 2017-06-23 MED ORDER — FLUCONAZOLE 150 MG PO TABS: 150.0000 mg | ORAL_TABLET | Freq: Once | ORAL | 0 refills | Status: AC

## 2017-06-23 MED ORDER — BETAMETHASONE VALERATE 0.1 % EX OINT: TOPICAL_OINTMENT | CUTANEOUS | 0 refills | Status: DC

## 2017-06-23 MED FILL — FLUCONAZOLE 150 MG TABLET: 150 | 3 days supply | Qty: 2 | Fill #0

## 2017-06-23 MED FILL — BETAMETHASONE VALER 0.1% OI: 0.1 | 14 days supply | Qty: 15 | Fill #0

## 2017-06-23 NOTE — Progress Notes (Signed)
GYNECOLOGY  VISIT   HPI: 46 y.o.   Married  [place] American  female   [phone] with Patient's last menstrual period was 03/29/2016 (approximate).   here for PUS & c/o vaginal burning & yellow discharge The patient has a h/o a TLH/BS in 5/17 in [place]. She had been having left sided discomfort and bladder/vaginal pressure last fall. The pain resolved. Starting last month she started having pain again, she felt lots of pain in her lower abdomen, felt like everything was going to fall out. No bulge at the opening of her vagina. The pain is currently better, it lasted for 3 days.  She feels she is voiding okay. Normal bowel movements.  She c/o vulvar itching starting this morning. Light yellow d/c, no odor. Extreme burning when she used a cleansing wipe.   GYNECOLOGIC HISTORY: Patient's last menstrual period was 03/29/2016 (approximate). Contraception: hysterectomy Menopausal hormone therapy: none        OB History    Gravida Para Term Preterm AB Living   5 4 4  0 1 4   SAB TAB Ectopic Multiple Live Births   1 0 0 0 4         Patient Active Problem List   Diagnosis Date Noted  . Morbid obesity due to excess calories ([place]) 01/22/2017  . Hypertension   . COPD (chronic obstructive pulmonary disease) ([place])   . Severe persistent asthma without complication     Past Medical History:  Diagnosis Date  . Asthma   . COPD (chronic obstructive pulmonary disease) ([place])   . Fibroid   . Genital warts   . Hypertension   . PID (pelvic inflammatory disease)   . Renal disorder     Past Surgical History:  Procedure Laterality Date  . COLPOSCOPY    . DILATION AND CURETTAGE OF UTERUS    . LAPAROSCOPIC TOTAL HYSTERECTOMY    . laser genital warts      Current Outpatient Prescriptions  Medication Sig Dispense Refill  . acetaminophen (TYLENOL) 500 MG tablet Take 1,000 mg by mouth every 6 (six) hours as needed for headache.    Marland Kitchen acyclovir (ZOVIRAX) 400 MG tablet Take 400 mg by mouth as needed  (For fever blisters.).    Marland Kitchen albuterol (PROAIR HFA) 108 (90 Base) MCG/ACT inhaler Inhale 2 puffs into the lungs every 4 (four) hours as needed for wheezing or shortness of breath. 1 Inhaler 1  . amLODipine (NORVASC) 5 MG tablet Take 5 mg by mouth daily.    Marland Kitchen azelastine (OPTIVAR) 0.05 % ophthalmic solution   3  . budesonide-formoterol (SYMBICORT) 160-4.5 MCG/ACT inhaler Take 2 puffs first thing in am and then another 2 puffs about 12 hours later. 1 Inhaler 3  . butalbital-acetaminophen-caffeine (FIORICET, ESGIC) 50-325-40 MG tablet Take 0.5 tablets by mouth 2 (two) times daily as needed for headache.    . cetirizine (ZYRTEC ALLERGY) 10 MG tablet Take 10 mg by mouth daily.    . chlorpheniramine-HYDROcodone (TUSSIONEX PENNKINETIC ER) 10-8 MG/5ML SUER Take 5 mLs by mouth every 12 (twelve) hours as needed for cough. 100 mL 0  . fluticasone furoate-vilanterol (BREO ELLIPTA) 100-25 MCG/INH AEPB Inhale 1 puff into the lungs daily. 30 each 11  . gabapentin (NEURONTIN) 300 MG capsule Take 300 mg by mouth daily as needed (restless leg).    Marland Kitchen ipratropium (ATROVENT) 0.02 % nebulizer solution Take 2.5 mLs (0.5 mg total) by nebulization 4 (four) times daily. 75 mL 1  . ipratropium (ATROVENT) 0.03 % nasal spray Place  2 sprays into both nostrils 2 (two) times daily. 30 mL 0  . montelukast (SINGULAIR) 10 MG tablet   1  . predniSONE (DELTASONE) 20 MG tablet Take 3 daily for 2 days, then 2 daily for 2 days, then 1 daily for 2 days for asthma 12 tablet 0  . ranitidine (ZANTAC) 150 MG tablet Take 150 mg by mouth 2 (two) times daily.     No current facility-administered medications for this visit.      ALLERGIES: Aspirin; Cantaloupe (diagnostic); Kiwi extract; Nsaids; Other; Pineapple; and Pyridium [phenazopyridine hcl]  Family History  Problem Relation Age of Onset  . Depression Mother   . Diabetes Mother   . Hepatitis C Mother   . Hypertension Mother   . Asthma Mother   . COPD Father   . Emphysema Father         smoked  . Hepatitis C Father   . Asthma Sister   . Breast cancer Sister   . Asthma Brother     Social History   Social History  . Marital status: Married    Spouse name: N/A  . Number of children: N/A  . Years of education: N/A   Occupational History  . Not on file.   Social History Main Topics  . Smoking status: Never Smoker  . Smokeless tobacco: Never Used  . Alcohol use 0.6 - 1.8 oz/week    1 - 3 Glasses of wine per week  . Drug use: No  . Sexual activity: Yes    Partners: Male    Birth control/ protection: Surgical   Other Topics Concern  . Not on file   Social History Narrative  . No narrative on file    Review of Systems  Genitourinary:       Vaginal burning & yellow itching    PHYSICAL EXAMINATION:    LMP 03/29/2016 (Approximate)     General appearance: alert, cooperative and appears stated age Abdomen: soft, non-tender; bowel sounds normal; no masses,  no organomegaly  Pelvic: External genitalia:  no lesions, mild erythema              Urethra:  normal appearing urethra with no masses, tenderness or lesions              Bartholins and Skenes: normal                 Vagina: normal appearing vagina with normal color and discharge, no lesions. No significant vaginal prolapse, cuff well supported.               Cervix: absent                Chaperone was present for exam.  Wet prep: no clue, no trich, ++ wbc KOH: ++ yeast PH: 4  Ultrasound images reviewed with the patient  ASSESSMENT Yeast vaginitis Pelvic pain, currently resolved, normal exam, normal gyn ultrasound     PLAN Treat yeast with diflucan, steroid ointment for severe itching and discomfort Call with recurrent pain   An After Visit Summary was printed and given to the patient.

## 2017-06-23 NOTE — Patient Instructions (Signed)

## 2017-07-09 ENCOUNTER — Ambulatory Visit: Payer: 59 | Admitting: Family Medicine

## 2017-07-31 ENCOUNTER — Other Ambulatory Visit: Payer: Self-pay | Admitting: Family Medicine

## 2017-07-31 MED ORDER — SULFAMETHOXAZOLE-TRIMETHOPRIM 800-160 MG PO TABS: 1.0000 | ORAL_TABLET | Freq: Two times a day (BID) | ORAL | 0 refills | Status: DC

## 2017-07-31 MED ORDER — SULFAMETHOXAZOLE-TRIMETHOPRIM 800-160 MG PO TABS: 1.0000 | ORAL_TABLET | Freq: Two times a day (BID) | ORAL | 0 refills | Status: AC

## 2017-07-31 NOTE — Progress Notes (Signed)
Phone call from patient about dysuria and cloudy foul smelling urine She had a urine dip done elsewhere with POSITIVE moderate LE but no ntirites   Sent in Bactrim ds

## 2017-08-28 ENCOUNTER — Other Ambulatory Visit: Payer: Self-pay | Admitting: Family Medicine

## 2017-08-28 MED ORDER — POLYMYXIN B-TRIMETHOPRIM 10000-0.1 UNIT/ML-% OP SOLN: OPHTHALMIC | 0 refills | Status: DC

## 2017-08-28 NOTE — Progress Notes (Signed)
polytrim sent in for pink eye Pt notified provider of purulent drainage Grandchildren with pink eye

## 2017-09-29 ENCOUNTER — Ambulatory Visit (INDEPENDENT_AMBULATORY_CARE_PROVIDER_SITE_OTHER): Payer: Self-pay | Admitting: Family Medicine

## 2017-09-29 ENCOUNTER — Encounter: Payer: Self-pay | Admitting: Family Medicine

## 2017-09-29 VITALS — BP 120/72 | HR 92 | Temp 98.6°F | Resp 16 | Ht 68.75 in | Wt 200.4 lb

## 2017-09-29 DIAGNOSIS — N941 Unspecified dyspareunia: Secondary | ICD-10-CM | POA: Diagnosis not present

## 2017-09-29 DIAGNOSIS — Z1321 Encounter for screening for nutritional disorder: Secondary | ICD-10-CM | POA: Diagnosis not present

## 2017-09-29 DIAGNOSIS — N898 Other specified noninflammatory disorders of vagina: Secondary | ICD-10-CM

## 2017-09-29 DIAGNOSIS — Z0000 Encounter for general adult medical examination without abnormal findings: Secondary | ICD-10-CM | POA: Diagnosis not present

## 2017-09-29 DIAGNOSIS — Z131 Encounter for screening for diabetes mellitus: Secondary | ICD-10-CM

## 2017-09-29 DIAGNOSIS — R232 Flushing: Secondary | ICD-10-CM

## 2017-09-29 DIAGNOSIS — Z760 Encounter for issue of repeat prescription: Secondary | ICD-10-CM

## 2017-09-29 DIAGNOSIS — J4541 Moderate persistent asthma with (acute) exacerbation: Secondary | ICD-10-CM

## 2017-09-29 LAB — POCT WET + KOH PREP
TRICH BY WET PREP: ABSENT
Yeast by KOH: ABSENT
Yeast by wet prep: ABSENT

## 2017-09-29 MED ORDER — MONTELUKAST SODIUM 10 MG PO TABS: 10.0000 mg | ORAL_TABLET | Freq: Every day | ORAL | 3 refills | Status: DC

## 2017-09-29 MED ORDER — GABAPENTIN 300 MG PO CAPS: 300.0000 mg | ORAL_CAPSULE | Freq: Every day | ORAL | 1 refills | Status: AC

## 2017-09-29 MED ORDER — METRONIDAZOLE 500 MG PO TABS: 500.0000 mg | ORAL_TABLET | Freq: Two times a day (BID) | ORAL | 0 refills | Status: DC

## 2017-09-29 MED ORDER — BUTALBITAL-APAP-CAFFEINE 50-325-40 MG PO TABS: 0.5000 | ORAL_TABLET | Freq: Two times a day (BID) | ORAL | 2 refills | Status: DC | PRN

## 2017-09-29 MED ORDER — FLUCONAZOLE 150 MG PO TABS: 150.0000 mg | ORAL_TABLET | Freq: Once | ORAL | 0 refills | Status: AC

## 2017-09-29 NOTE — Progress Notes (Signed)
Chief Complaint  Patient presents with  . Annual Exam    left ring finger injury on Saturday, ? jammed and still having pain    Subjective:  Deborah Cordova is a 46 y.o. female here for a health maintenance visit.  Patient is established pt  Patient Active Problem List   Diagnosis Date Noted  . Morbid obesity due to excess calories ([place]) 01/22/2017  . Hypertension   . COPD (chronic obstructive pulmonary disease) ([place])   . Severe persistent asthma without complication     Past Medical History:  Diagnosis Date  . Asthma   . COPD (chronic obstructive pulmonary disease) ([place])   . Fibroid   . Genital warts   . Hypertension   . PID (pelvic inflammatory disease)   . Renal disorder     Past Surgical History:  Procedure Laterality Date  . COLPOSCOPY    . DILATION AND CURETTAGE OF UTERUS    . LAPAROSCOPIC TOTAL HYSTERECTOMY    . laser genital warts       Outpatient Medications Prior to Visit  Medication Sig Dispense Refill  . acetaminophen (TYLENOL) 500 MG tablet Take 1,000 mg by mouth every 6 (six) hours as needed for headache.    Marland Kitchen acyclovir (ZOVIRAX) 400 MG tablet Take 400 mg by mouth as needed (For fever blisters.).    Marland Kitchen albuterol (PROAIR HFA) 108 (90 Base) MCG/ACT inhaler Inhale 2 puffs into the lungs every 4 (four) hours as needed for wheezing or shortness of breath. 1 Inhaler 1  . amLODipine (NORVASC) 5 MG tablet Take 5 mg by mouth daily.    . betamethasone valerate ointment (VALISONE) 0.1 % Apply a pea sized amount BID for 1-2 weeks prn 15 g 0  . budesonide-formoterol (SYMBICORT) 160-4.5 MCG/ACT inhaler Take 2 puffs first thing in am and then another 2 puffs about 12 hours later. 1 Inhaler 3  . cetirizine (ZYRTEC ALLERGY) 10 MG tablet Take 10 mg by mouth daily.    Marland Kitchen ipratropium (ATROVENT) 0.02 % nebulizer solution Take 2.5 mLs (0.5 mg total) by nebulization 4 (four) times daily. 75 mL 1  . ranitidine (ZANTAC) 150 MG tablet Take 150 mg by mouth 2 (two) times daily.    Marland Kitchen  trimethoprim-polymyxin b (POLYTRIM) ophthalmic solution One drop to the affected eye every 6 hrs for 7 days 10 mL 0  . butalbital-acetaminophen-caffeine (FIORICET, ESGIC) 50-325-40 MG tablet Take 0.5 tablets by mouth 2 (two) times daily as needed for headache.    . gabapentin (NEURONTIN) 300 MG capsule Take 300 mg by mouth daily as needed (restless leg).    . montelukast (SINGULAIR) 10 MG tablet   1  . ipratropium (ATROVENT) 0.03 % nasal spray Place 2 sprays into both nostrils 2 (two) times daily. (Patient not taking: Reported on 09/29/2017) 30 mL 0  . predniSONE (DELTASONE) 20 MG tablet Take 3 daily for 2 days, then 2 daily for 2 days, then 1 daily for 2 days for asthma (Patient not taking: Reported on 09/29/2017) 12 tablet 0   No facility-administered medications prior to visit.     Allergies  Allergen Reactions  . Septra [Sulfamethoxazole-Trimethoprim] Hives, Swelling and Rash  . Aspirin Other (See Comments)    Asthma flare up - wheezing  . Cantaloupe (Diagnostic) Itching and Other (See Comments)    Makes tongue itch  . Kiwi Extract Itching and Other (See Comments)    Makes tongue itch  . Nsaids Other (See Comments)    Asthma flare up - wheezing   .  Other Other (See Comments)    Pecans and walnuts---induce asthma attack   . Pineapple Other (See Comments)    Tongue bleeds  . Pyridium [Phenazopyridine Hcl] Itching    Itching, swelling and burning     Family History  Problem Relation Age of Onset  . Depression Mother   . Diabetes Mother   . Hepatitis C Mother   . Hypertension Mother   . Asthma Mother   . COPD Father   . Emphysema Father        smoked  . Hepatitis C Father   . Asthma Sister   . Breast cancer Sister   . Asthma Brother   . Diabetes Brother   . Hyperlipidemia Brother   . Stroke Maternal Grandmother   . Cancer Maternal Grandfather   . Cancer Paternal Grandmother   . Cancer Paternal Grandfather      Health Habits: Dental Exam: up to date Eye Exam: up  to date Exercise: 3-4 times/week on average Current exercise activities: walking/running Diet:  balanced  Social History   Social History  . Marital status: Married    Spouse name: N/A  . Number of children: N/A  . Years of education: N/A   Occupational History  . Not on file.   Social History Main Topics  . Smoking status: Never Smoker  . Smokeless tobacco: Never Used  . Alcohol use 0.6 - 1.8 oz/week    1 - 3 Glasses of wine per week  . Drug use: No  . Sexual activity: Yes    Partners: Male    Birth control/ protection: Surgical     Comment: hysterectomy   Other Topics Concern  . Not on file   Social History Narrative  . No narrative on file   History  Alcohol Use  . 0.6 - 1.8 oz/week  . 1 - 3 Glasses of wine per week   History  Smoking Status  . Never Smoker  Smokeless Tobacco  . Never Used   History  Drug Use No    GYN: Sexual Health Menstrual status: regular menses LMP: Patient's last menstrual period was 03/29/2016 (approximate). Last pap smear: see HM section History of abnormal pap smears:  Sexually active: with female partner Current contraception: hysterectomy  Health Maintenance: See under health Maintenance activity for review of completion dates as well. Immunization History  Administered Date(s) Administered  . Influenza Split 09/30/2006, 10/02/2015  . Influenza Whole 09/28/2016      Depression Screen-PHQ2/9 Depression screen [facility] 2/9 09/29/2017 06/17/2017 05/07/2017 03/16/2017 02/06/2017  Decreased Interest 0 0 0 0 0  Down, Depressed, Hopeless 0 0 0 0 0  PHQ - 2 Score 0 0 0 0 0    Depression Severity and Treatment Recommendations:  0-4= None  5-9= Mild / Treatment: Support, educate to call if worse; return in one month  10-14= Moderate / Treatment: Support, watchful waiting; Antidepressant or Psycotherapy  15-19= Moderately severe / Treatment: Antidepressant OR Psychotherapy  >= 20 = Major depression, severe / Antidepressant AND  Psychotherapy    Review of Systems   Review of Systems  Constitutional: Negative for chills, fever and weight loss.  HENT: Negative for congestion, ear discharge, ear pain and nosebleeds.   Eyes: Negative for blurred vision and double vision.  Respiratory: Negative for cough, shortness of breath and wheezing.   Cardiovascular: Negative for chest pain, palpitations and claudication.  Gastrointestinal: Negative for abdominal pain, constipation, diarrhea, nausea and vomiting.  Genitourinary: Negative for dysuria, frequency and urgency.  Neurological: Negative for dizziness, tingling, tremors and headaches.  Psychiatric/Behavioral: Negative for depression. The patient is not nervous/anxious.    She has 2 weeks of dyspareunia With left side pelvic pain  See HPI for ROS as well.    Objective:   Vitals:   09/29/17 1115  BP: 120/72  Pulse: 92  Resp: 16  Temp: 98.6 F (37 C)  TempSrc: Oral  SpO2: 98%  Weight: 200 lb 6.4 oz (90.9 kg)  Height: 5' 8.75" (1.746 m)    Body mass index is 29.81 kg/m.  Physical Exam  Constitutional: She is oriented to person, place, and time. She appears well-developed and well-nourished.  HENT:  Head: Normocephalic and atraumatic.  Eyes: Conjunctivae and EOM are normal.  Cardiovascular: Normal rate, regular rhythm and normal heart sounds.   No murmur heard. Pulmonary/Chest: Effort normal and breath sounds normal. No respiratory distress. She has no wheezes. She has no rales.  Abdominal: Soft. Bowel sounds are normal. She exhibits no distension and no mass. There is no tenderness. There is no rebound and no guarding.  Musculoskeletal: Normal range of motion. She exhibits no edema.  Neurological: She is alert and oriented to person, place, and time. She has normal reflexes.  Skin: Skin is warm. No erythema.  Psychiatric: She has a normal mood and affect. Her behavior is normal. Judgment and thought content normal.    Vaginal exam- chaperone  present Labia normal bilaterally without skin lesions Urethral meatus normal appearing without erythema Vagina with white discharge ovaries small and not palpable     Assessment/Plan:   Patient was seen for a health maintenance exam.  Counseled the patient on health maintenance issues. Reviewed her health mainteance schedule and ordered appropriate tests (see orders.) Counseled on regular exercise and weight management. Recommend regular eye exams and dental cleaning.   The following issues were addressed today for health maintenance:   Deborah Cordova was seen today for annual exam.  Diagnoses and all orders for this visit:  Encounter for health maintenance examination in adult- age appropriate screening reviewed -     Lipid panel -     Comprehensive metabolic panel -     CBC  Moderate persistent asthma with acute exacerbation- discussed asthma attack prevention  Screening for diabetes mellitus -     Hemoglobin A1c  Dyspareunia, female- advised pt that dyspareunia can be due to pelvic infection Wet prep with lots of bacteria but no clue cells Will treat with flagyl -     POCT Wet + KOH Prep -     GC/Chlamydia Probe Amp  Vaginal discharge -     POCT Wet + KOH Prep -     GC/Chlamydia Probe Amp  Encounter for vitamin deficiency screening -     VITAMIN D 25 Hydroxy (Vit-D Deficiency, Fractures)  Hot flashes- pt s/p hysterectomy but has hot flashes, will treat -     FSH/LH  Other orders -     gabapentin (NEURONTIN) 300 MG capsule; Take 1 capsule (300 mg total) by mouth at bedtime. -     butalbital-acetaminophen-caffeine (FIORICET, ESGIC) 50-325-40 MG tablet; Take 0.5 tablets by mouth 2 (two) times daily as needed for headache. -     montelukast (SINGULAIR) 10 MG tablet; Take 1 tablet (10 mg total) by mouth at bedtime. -     metroNIDAZOLE (FLAGYL) 500 MG tablet; Take 1 tablet (500 mg total) by mouth 2 (two) times daily. -     fluconazole (DIFLUCAN) 150 MG tablet; Take 1 tablet  (  150 mg total) by mouth once. Repeat in 3 days. -     FSH/LH    No Follow-up on file.    Body mass index is 29.81 kg/m.:  Discussed the patient's BMI with patient. The BMI body mass index is 29.81 kg/m.     No future appointments.  Patient Instructions       IF you received an x-ray today, you will receive an invoice from [facility]. Please contact [facility] at [phone] with questions or concerns regarding your invoice.   IF you received labwork today, you will receive an invoice from [facility]. Please contact [facility] at [phone] with questions or concerns regarding your invoice.   Our billing staff will not be able to assist you with questions regarding bills from these companies.  You will be contacted with the lab results as soon as they are available. The fastest way to get your results is to activate your My Chart account. Instructions are located on the last page of this paperwork. If you have not heard from [place] regarding the results in 2 weeks, please contact this office.    We recommend that you schedule a mammogram for breast cancer screening. Typically, you do not need a referral to do this. Please contact a local imaging center to schedule your mammogram.  [facility] - [phone]  *ask for the Radiology Department [facility]) - [phone] or [phone]  [facility] - [phone] [place] [phone] [facility] - [phone]  *ask for the [facility] - [phone]  *ask for the Radiology Department [facility] - [phone]  *ask for the Mammography Department [facility] - [phone] Dyspareunia, Female Dyspareunia is pain that is associated with sexual activity. This can affect any part of the genitals or lower abdomen, and there are many possible causes. This condition ranges from  mild to severe. Depending on the cause, dyspareunia may get better with treatment, or it may return (recur) over time. What are the causes? The cause of this condition is not always known. Possible causes include:  Cancer.  Psychological factors, such as depression, anxiety, or previous traumatic experiences.  Severe pain and tenderness of the skin around the vagina (vulva) when it is touched (vulvar vestibulitis syndrome).  Infection of the pelvis or the vulva.  Infection of the vagina.  Painful, involuntary tightening (contraction) of the vaginal muscles when anything is put inside the vagina (vaginismus).  Allergic reaction.  Ovarian cysts.  Solid growths of tissue (tumors) in the ovaries or the uterus.  Scar tissue in the ovaries, vagina, or pelvis.  Vaginal dryness.  Thinning of the tissue (atrophy) of the vulva and vagina.  Skin conditions that affect the vulva (vulvar dermatoses), such as lichen sclerosus or lichen planus.  Endometriosis.  Tubal pregnancy.  A tilted uterus.  Uterine prolapse.  Adhesions in the vagina.  Bladder problems.  Intestinal problems.  Certain medicines.  Medical conditions such as diabetes, arthritis, or thyroid disease.  What increases the risk? The following factors may make you more likely to develop this condition:  Having experienced physical or sexual trauma.  Having given birth more than once.  Taking birth control pills.  Having gone through menopause.  Having recently given birth, typically within the past 3-6 months.  Breastfeeding.  What are the signs or symptoms? The main symptom of this condition is pain in any part of the  genitals or lower abdomen during or after sexual activity. This may include pain during sexual arousal, genital stimulation, or orgasm. Pain may get worse when anything is inserted into the vagina, or when the genitals are touched in any way, such as when sitting or wearing pants. Pain can  range from mild to severe, depending on the cause of the condition. In some cases, symptoms go away with treatment and return (recur) at a later date. How is this diagnosed? This condition may be diagnosed based on:  Your symptoms, including: ? Where your pain is located. ? When your pain occurs.  Your medical history.  A physical exam. This may include a pelvic exam and a Pap test. This is a screening test that is used to check for signs of cancer of the vagina, cervix, and uterus.  Tests, including: ? Blood tests. ? Ultrasound. This uses sound waves to make a picture of the area that is being tested. ? Urine culture. This test involves checking a urine sample for signs of infection. ? Culture test. This is when your health care provider uses a swab to collect a sample of vaginal fluid. The sample is checked for signs of infection. ? X-rays. ? MRI. ? CT scan. ? Laparoscopy. This is a procedure in which a small incision is made in your lower abdomen and a lighted, pencil-sized instrument (laparoscope) is passed through the incision and used to look inside your pelvis.  You may be referred to a health care provider who specializes in women's health (gynecologist). In some cases, diagnosing the cause of dyspareunia can be difficult. How is this treated? Treatment depends on the cause of your condition and your symptoms. In most cases, you may need to stop sexual activity until your symptoms improve. Treatment may include:  Lubricants.  Kegel exercises or vaginal dilators.  Medicated skin creams.  Medicated vaginal creams.  Hormonal therapy.  Antibiotic medicine to prevent or fight infection.  Medicines that help to relieve pain.  Medicines that treat depression (antidepressants).  Psychological counseling.  Sex therapy.  Surgery.  Follow these instructions at home: Lifestyle  Avoid tight clothing and irritating materials around your genital and abdominal area.  Use  water-based lubricants as needed. Avoid oil-based lubricants.  Do not use any products that irritate you. This may include certain condoms, spermicides, lubricants, soaps, tampons, vaginal sprays, or douches.  Always practice safe sex. Talk with your health care provider about which form of birth control (contraception) is best for you.  Maintain open communication with your sexual partner. General instructions  Take over-the-counter and prescription medicines only as told by your health care provider.  If you had tests done, it is your responsibility to get your tests results. Ask your health care provider or the department performing the test when your results will be ready.  Urinate before you engage in sexual activity.  Consider joining a support group.  Keep all follow-up visits as told by your health care provider. This is important. Contact a health care provider if:  You develop vaginal bleeding after sexual intercourse.  You develop a lump at the opening of your vagina. Seek medical care even if the lump is painless.  You have: ? Abnormal vaginal discharge. ? Vaginal dryness. ? Itchiness or irritation of your vulva or vagina. ? A new rash. ? Symptoms that get worse or do not improve with treatment. ? A fever. ? Pain when you urinate. ? Blood in your urine. Get help right away if:  You develop severe pain in your abdomen during or shortly after sexual intercourse.  You pass out after having sexual intercourse. This information is not intended to replace advice given to you by your health care provider. Make sure you discuss any questions you have with your health care provider. Document Released: 01/04/2008 Document Revised: 04/25/2016 Document Reviewed: 07/17/2015 Elsevier Interactive Patient Education  [employer].

## 2017-09-29 NOTE — Patient Instructions (Addendum)
IF you received an x-ray today, you will receive an invoice from [facility]. Please contact [facility] at [phone] with questions or concerns regarding your invoice.   IF you received labwork today, you will receive an invoice from [place]. Please contact [facility] at [phone] with questions or concerns regarding your invoice.   Our billing staff will not be able to assist you with questions regarding bills from these companies.  You will be contacted with the lab results as soon as they are available. The fastest way to get your results is to activate your My Chart account. Instructions are located on the last page of this paperwork. If you have not heard from [place] regarding the results in 2 weeks, please contact this office.    We recommend that you schedule a mammogram for breast cancer screening. Typically, you do not need a referral to do this. Please contact a local imaging center to schedule your mammogram.  [facility] [place] - [phone]  *ask for the Radiology Department [facility] ([place]) - [phone] or [phone]  [facility] - [phone] [place] [phone] [facility] - [phone]  *ask for the [facility] - [phone]  *ask for the Radiology Department [facility] - [phone]  *ask for the Mammography Department [facility] - [phone] Dyspareunia, Female Dyspareunia is pain that is associated with sexual activity. This can affect any part of the genitals or lower abdomen, and there are many possible causes. This condition ranges from mild to severe. Depending on the cause, dyspareunia may get better with treatment, or it may return (recur) over time. What are the causes? The cause of this condition is not always known. Possible causes include:  Cancer.  Psychological factors, such as depression,  anxiety, or previous traumatic experiences.  Severe pain and tenderness of the skin around the vagina (vulva) when it is touched (vulvar vestibulitis syndrome).  Infection of the pelvis or the vulva.  Infection of the vagina.  Painful, involuntary tightening (contraction) of the vaginal muscles when anything is put inside the vagina (vaginismus).  Allergic reaction.  Ovarian cysts.  Solid growths of tissue (tumors) in the ovaries or the uterus.  Scar tissue in the ovaries, vagina, or pelvis.  Vaginal dryness.  Thinning of the tissue (atrophy) of the vulva and vagina.  Skin conditions that affect the vulva (vulvar dermatoses), such as lichen sclerosus or lichen planus.  Endometriosis.  Tubal pregnancy.  A tilted uterus.  Uterine prolapse.  Adhesions in the vagina.  Bladder problems.  Intestinal problems.  Certain medicines.  Medical conditions such as diabetes, arthritis, or thyroid disease.  What increases the risk? The following factors may make you more likely to develop this condition:  Having experienced physical or sexual trauma.  Having given birth more than once.  Taking birth control pills.  Having gone through menopause.  Having recently given birth, typically within the past 3-6 months.  Breastfeeding.  What are the signs or symptoms? The main symptom of this condition is pain in any part of the genitals or lower abdomen during or after sexual activity. This may include pain during sexual arousal, genital stimulation, or orgasm. Pain may get worse when anything is inserted into the vagina, or when the genitals are touched in any way, such as when sitting or wearing pants. Pain can range from mild to severe, depending on the cause of the  condition. In some cases, symptoms go away with treatment and return (recur) at a later date. How is this diagnosed? This condition may be diagnosed based on:  Your symptoms, including: ? Where your pain is  located. ? When your pain occurs.  Your medical history.  A physical exam. This may include a pelvic exam and a Pap test. This is a screening test that is used to check for signs of cancer of the vagina, cervix, and uterus.  Tests, including: ? Blood tests. ? Ultrasound. This uses sound waves to make a picture of the area that is being tested. ? Urine culture. This test involves checking a urine sample for signs of infection. ? Culture test. This is when your health care provider uses a swab to collect a sample of vaginal fluid. The sample is checked for signs of infection. ? X-rays. ? MRI. ? CT scan. ? Laparoscopy. This is a procedure in which a small incision is made in your lower abdomen and a lighted, pencil-sized instrument (laparoscope) is passed through the incision and used to look inside your pelvis.  You may be referred to a health care provider who specializes in women's health (gynecologist). In some cases, diagnosing the cause of dyspareunia can be difficult. How is this treated? Treatment depends on the cause of your condition and your symptoms. In most cases, you may need to stop sexual activity until your symptoms improve. Treatment may include:  Lubricants.  Kegel exercises or vaginal dilators.  Medicated skin creams.  Medicated vaginal creams.  Hormonal therapy.  Antibiotic medicine to prevent or fight infection.  Medicines that help to relieve pain.  Medicines that treat depression (antidepressants).  Psychological counseling.  Sex therapy.  Surgery.  Follow these instructions at home: Lifestyle  Avoid tight clothing and irritating materials around your genital and abdominal area.  Use water-based lubricants as needed. Avoid oil-based lubricants.  Do not use any products that irritate you. This may include certain condoms, spermicides, lubricants, soaps, tampons, vaginal sprays, or douches.  Always practice safe sex. Talk with your health care  provider about which form of birth control (contraception) is best for you.  Maintain open communication with your sexual partner. General instructions  Take over-the-counter and prescription medicines only as told by your health care provider.  If you had tests done, it is your responsibility to get your tests results. Ask your health care provider or the department performing the test when your results will be ready.  Urinate before you engage in sexual activity.  Consider joining a support group.  Keep all follow-up visits as told by your health care provider. This is important. Contact a health care provider if:  You develop vaginal bleeding after sexual intercourse.  You develop a lump at the opening of your vagina. Seek medical care even if the lump is painless.  You have: ? Abnormal vaginal discharge. ? Vaginal dryness. ? Itchiness or irritation of your vulva or vagina. ? A new rash. ? Symptoms that get worse or do not improve with treatment. ? A fever. ? Pain when you urinate. ? Blood in your urine. Get help right away if:  You develop severe pain in your abdomen during or shortly after sexual intercourse.  You pass out after having sexual intercourse. This information is not intended to replace advice given to you by your health care provider. Make sure you discuss any questions you have with your health care provider. Document Released: 01/04/2008 Document Revised: 04/25/2016 Document Reviewed: 07/17/2015  Chartered certified accountant Patient Education  [employer].

## 2017-09-30 LAB — COMPREHENSIVE METABOLIC PANEL
ALBUMIN: 4.3 g/dL (ref 3.5–5.5)
ALT: 14 IU/L (ref 0–32)
AST: 14 IU/L (ref 0–40)
Albumin/Globulin Ratio: 1.7 (ref 1.2–2.2)
Alkaline Phosphatase: 52 IU/L (ref 39–117)
BUN / CREAT RATIO: 12 (ref 9–23)
BUN: 9 mg/dL (ref 6–24)
Bilirubin Total: 0.6 mg/dL (ref 0.0–1.2)
CALCIUM: 9.5 mg/dL (ref 8.7–10.2)
CO2: 25 mmol/L (ref 20–29)
CREATININE: 0.77 mg/dL (ref 0.57–1.00)
Chloride: 106 mmol/L (ref 96–106)
GFR, EST AFRICAN AMERICAN: 107 mL/min/{1.73_m2} (ref >59)
GFR, EST NON AFRICAN AMERICAN: 93 mL/min/{1.73_m2} (ref >59)
GLOBULIN, TOTAL: 2.6 g/dL (ref 1.5–4.5)
GLUCOSE: 83 mg/dL (ref 65–99)
Potassium: 4.5 mmol/L (ref 3.5–5.2)
SODIUM: 141 mmol/L (ref 134–144)
TOTAL PROTEIN: 6.9 g/dL (ref 6.0–8.5)

## 2017-09-30 LAB — CBC
HEMOGLOBIN: 13.5 g/dL (ref 11.1–15.9)
Hematocrit: 40.7 % (ref 34.0–46.6)
MCH: 30.2 pg (ref 26.6–33.0)
MCHC: 33.2 g/dL (ref 31.5–35.7)
MCV: 91 fL (ref 79–97)
Platelets: 318 10*3/uL (ref 150–379)
RBC: 4.47 x10E6/uL (ref 3.77–5.28)
RDW: 13 % (ref 12.3–15.4)
WBC: 8.5 10*3/uL (ref 3.4–10.8)

## 2017-09-30 LAB — VITAMIN D 25 HYDROXY (VIT D DEFICIENCY, FRACTURES): Vit D, 25-Hydroxy: 23 ng/mL — ABNORMAL LOW (ref 30.0–100.0)

## 2017-09-30 LAB — LIPID PANEL
CHOLESTEROL TOTAL: 165 mg/dL (ref 100–199)
Chol/HDL Ratio: 2.4 ratio (ref 0.0–4.4)
HDL: 69 mg/dL (ref >39)
LDL Calculated: 78 mg/dL (ref 0–99)
Triglycerides: 89 mg/dL (ref 0–149)
VLDL CHOLESTEROL CAL: 18 mg/dL (ref 5–40)

## 2017-09-30 LAB — FSH/LH
FSH: 5.9 m[IU]/mL
LH: 7.9 m[IU]/mL

## 2017-09-30 LAB — HEMOGLOBIN A1C
Est. average glucose Bld gHb Est-mCnc: 103 mg/dL
Hgb A1c MFr Bld: 5.2 % (ref 4.8–5.6)

## 2017-10-01 LAB — GC/CHLAMYDIA PROBE AMP
Chlamydia trachomatis, NAA: NEGATIVE
Neisseria gonorrhoeae by PCR: NEGATIVE

## 2017-10-05 ENCOUNTER — Encounter: Payer: Self-pay | Admitting: Physician Assistant

## 2017-10-05 ENCOUNTER — Ambulatory Visit (INDEPENDENT_AMBULATORY_CARE_PROVIDER_SITE_OTHER): Payer: Self-pay

## 2017-10-05 ENCOUNTER — Ambulatory Visit (INDEPENDENT_AMBULATORY_CARE_PROVIDER_SITE_OTHER): Payer: Self-pay | Admitting: Physician Assistant

## 2017-10-05 VITALS — BP 102/78 | HR 102 | Temp 97.9°F | Resp 16 | Ht 68.75 in | Wt 203.0 lb

## 2017-10-05 DIAGNOSIS — M79645 Pain in left finger(s): Secondary | ICD-10-CM

## 2017-10-05 DIAGNOSIS — Z299 Encounter for prophylactic measures, unspecified: Secondary | ICD-10-CM

## 2017-10-05 DIAGNOSIS — J3489 Other specified disorders of nose and nasal sinuses: Secondary | ICD-10-CM

## 2017-10-05 MED ORDER — DOXYCYCLINE HYCLATE 100 MG PO CAPS: 100.0000 mg | ORAL_CAPSULE | Freq: Two times a day (BID) | ORAL | 0 refills | Status: AC

## 2017-10-05 MED ORDER — FLUCONAZOLE 150 MG PO TABS: 150.0000 mg | ORAL_TABLET | Freq: Once | ORAL | 0 refills | Status: AC

## 2017-10-05 MED ORDER — DOXYCYCLINE HYCLATE 100 MG PO CAPS: 100.0000 mg | ORAL_CAPSULE | Freq: Two times a day (BID) | ORAL | 0 refills | Status: DC

## 2017-10-05 NOTE — Addendum Note (Signed)
Addended by: Tereasa Coop on: 10/05/2017 05:30 PM   Modules accepted: Orders

## 2017-10-05 NOTE — Progress Notes (Signed)
10/05/2017 5:05 PM   DOB: 06-15-1971 / MRN: [id number]  SUBJECTIVE:  Deborah Cordova is a 46 y.o. female presenting for facial pain and nasal congestion that started 3 days ago and is worsening.  Associates pain in her teeth. Denies fever, chills.  Associates fatigue.  No sore throat or cough.  She is allergic to septra [sulfamethoxazole-trimethoprim]; aspirin; cantaloupe (diagnostic); kiwi extract; nsaids; other; pineapple; and pyridium [phenazopyridine hcl].   She  has a past medical history of Asthma; COPD (chronic obstructive pulmonary disease) ([place]); Fibroid; Genital warts; Hypertension; PID (pelvic inflammatory disease); and Renal disorder.    She  reports that she has never smoked. She has never used smokeless tobacco. She reports that she drinks about 0.6 - 1.8 oz of alcohol per week . She reports that she does not use drugs. She  reports that she currently engages in sexual activity and has had female partners. She reports using the following method of birth control/protection: Surgical. The patient  has a past surgical history that includes Dilation and curettage of uterus; Colposcopy; laser genital warts; and Laparoscopic total hysterectomy.  Her family history includes Asthma in her brother, mother, and sister; Breast cancer in her sister; COPD in her father; Cancer in her maternal grandfather, paternal grandfather, and paternal grandmother; Depression in her mother; Diabetes in her brother and mother; Emphysema in her father; Hepatitis C in her father and mother; Hyperlipidemia in her brother; Hypertension in her mother; Stroke in her maternal grandmother.  Review of Systems  Constitutional: Negative for chills and fever.  HENT: Positive for sinus pain. Negative for sore throat.   Cardiovascular: Negative for chest pain.  Gastrointestinal: Negative for nausea.  Neurological: Negative for dizziness.    The problem list and medications were reviewed and updated by myself where  necessary and exist elsewhere in the encounter.   OBJECTIVE:  BP 102/78 (BP Location: Left Arm, Patient Position: Sitting, Cuff Size: Large)   Pulse (!) 102   Temp 97.9 F (36.6 C) (Oral)   Resp 16   Ht 5' 8.75" (1.746 m)   Wt 203 lb (92.1 kg)   LMP 03/29/2016 (Approximate)   SpO2 98%   BMI 30.20 kg/m   Physical Exam  Constitutional: She is active.  Non-toxic appearance.  HENT:  Head:    Right Ear: Hearing, tympanic membrane, external ear and ear canal normal.  Left Ear: Hearing, tympanic membrane, external ear and ear canal normal.  Nose: Nose normal. Right sinus exhibits no maxillary sinus tenderness and no frontal sinus tenderness. Left sinus exhibits no frontal sinus tenderness.  Mouth/Throat: Uvula is midline, oropharynx is clear and moist and mucous membranes are normal. Mucous membranes are not dry. No oropharyngeal exudate, posterior oropharyngeal edema or tonsillar abscesses.  Cardiovascular: Normal rate.   Pulmonary/Chest: Effort normal. No tachypnea.  Lymphadenopathy:       Head (right side): No submandibular and no tonsillar adenopathy present.       Head (left side): No submandibular and no tonsillar adenopathy present.    She has no cervical adenopathy.  Neurological: She is alert.  Skin: Skin is warm and dry. She is not diaphoretic. No pallor.    No results found for this or any previous visit (from the past 72 hour(s)).  Dg Hand Complete Left  Result Date: 10/05/2017 CLINICAL DATA:  Left finger pain.  No report of injury. EXAM: LEFT HAND - COMPLETE 3+ VIEW COMPARISON:  None in PACs FINDINGS: The bones are subjectively adequately mineralized. The joint  spaces are reasonably well-maintained. There is no acute or healing fracture. There is no dislocation. There is no significant joint effusion. IMPRESSION: There is no acute or significant chronic bony abnormality of the left hand. Electronically Signed   By: David  Martinique M.D.   On: 10/05/2017 16:42     ASSESSMENT AND PLAN:  Deshannon was seen today for sinus and hand pain.  Diagnoses and all orders for this visit:  Finger pain, left:  -     DG Hand Complete Left -     Discontinue: doxycycline (VIBRAMYCIN) 100 MG capsule; Take 1 capsule (100 mg total) by mouth 2 (two) times daily.  Sinus pain Comments: Will treat for an ABRS given lack of other viral symptoms and she is complaint with both zyrtec and singulair.  Orders: -     doxycycline (VIBRAMYCIN) 100 MG capsule; Take 1 capsule (100 mg total) by mouth 2 (two) times daily.    The patient is advised to call or return to clinic if she does not see an improvement in symptoms, or to seek the care of the closest emergency department if she worsens with the above plan.   Philis Fendt, MHS, PA-C [facility] 10/05/2017 5:05 PM

## 2017-10-06 MED FILL — MONTELUKAST SOD 10 MG TAB: 10 | 90 days supply | Qty: 90 | Fill #0

## 2017-10-12 ENCOUNTER — Ambulatory Visit (INDEPENDENT_AMBULATORY_CARE_PROVIDER_SITE_OTHER): Payer: 59 | Admitting: Family Medicine

## 2017-10-12 ENCOUNTER — Ambulatory Visit (INDEPENDENT_AMBULATORY_CARE_PROVIDER_SITE_OTHER): Payer: 59

## 2017-10-12 ENCOUNTER — Encounter: Payer: Self-pay | Admitting: Family Medicine

## 2017-10-12 VITALS — BP 126/70 | HR 77 | Temp 98.2°F | Resp 16 | Wt 203.0 lb

## 2017-10-12 DIAGNOSIS — R1031 Right lower quadrant pain: Secondary | ICD-10-CM | POA: Diagnosis not present

## 2017-10-12 DIAGNOSIS — M545 Low back pain, unspecified: Secondary | ICD-10-CM

## 2017-10-12 DIAGNOSIS — J4541 Moderate persistent asthma with (acute) exacerbation: Secondary | ICD-10-CM | POA: Diagnosis not present

## 2017-10-12 DIAGNOSIS — Z760 Encounter for issue of repeat prescription: Secondary | ICD-10-CM | POA: Diagnosis not present

## 2017-10-12 DIAGNOSIS — R109 Unspecified abdominal pain: Secondary | ICD-10-CM | POA: Diagnosis not present

## 2017-10-12 LAB — POCT URINALYSIS DIP (MANUAL ENTRY)
Bilirubin: NEGATIVE
Blood, UA: NEGATIVE
GLUCOSE UA: NEGATIVE mg/dL
Ketones, POC UA: NEGATIVE mg/dL
LEUKOCYTES UA: NEGATIVE
NITRITE UA: NEGATIVE
Protein Ur, POC: NEGATIVE mg/dL
Spec Grav, UA: 1.015 (ref 1.010–1.025)
UROBILINOGEN UA: 0.2 U/dL
pH, UA: 6.5 (ref 5.0–8.0)

## 2017-10-12 LAB — POCT CBC
Granulocyte percent: 43.7 %G (ref 37–80)
HEMATOCRIT: 40.2 % (ref 37.7–47.9)
Hemoglobin: 13.4 g/dL (ref 12.2–16.2)
LYMPH, POC: 4.5 — AB (ref 0.6–3.4)
MCH, POC: 30.3 pg (ref 27–31.2)
MCHC: 33.3 g/dL (ref 31.8–35.4)
MCV: 91 fL (ref 80–97)
MID (CBC): 0.7 (ref 0–0.9)
MPV: 7.6 fL (ref 0–99.8)
PLATELET COUNT, POC: 319 10*3/uL (ref 142–424)
POC Granulocyte: 4.1 (ref 2–6.9)
POC LYMPH PERCENT: 48.3 %L (ref 10–50)
POC MID %: 8 % (ref 0–12)
RBC: 4.42 M/uL (ref 4.04–5.48)
RDW, POC: 12.4 %
WBC: 9.3 10*3/uL (ref 4.6–10.2)

## 2017-10-12 MED ORDER — ALBUTEROL SULFATE HFA 108 (90 BASE) MCG/ACT IN AERS: 2.0000 | INHALATION_SPRAY | RESPIRATORY_TRACT | 0 refills | Status: DC | PRN

## 2017-10-12 MED ORDER — IPRATROPIUM BROMIDE 0.03 % NA SOLN: 2.0000 | Freq: Two times a day (BID) | NASAL | 0 refills | Status: DC

## 2017-10-12 MED ORDER — MONTELUKAST SODIUM 10 MG PO TABS: 10.0000 mg | ORAL_TABLET | Freq: Every day | ORAL | 3 refills | Status: DC

## 2017-10-12 MED ORDER — BUDESONIDE-FORMOTEROL FUMARATE 160-4.5 MCG/ACT IN AERO
INHALATION_SPRAY | RESPIRATORY_TRACT | 1 refills | Status: DC
Start: 2017-10-12 — End: 2017-12-14

## 2017-10-12 MED ORDER — TAMSULOSIN HCL 0.4 MG PO CAPS: 0.4000 mg | ORAL_CAPSULE | Freq: Every day | ORAL | 3 refills | Status: DC

## 2017-10-12 MED ORDER — IPRATROPIUM BROMIDE 0.02 % IN SOLN: 0.5000 mg | Freq: Four times a day (QID) | RESPIRATORY_TRACT | 1 refills | Status: DC

## 2017-10-12 MED ORDER — TRAMADOL HCL 50 MG PO TABS: 50.0000 mg | ORAL_TABLET | Freq: Three times a day (TID) | ORAL | 0 refills | Status: DC | PRN

## 2017-10-12 MED FILL — traMADol HCL 50 MG TABS: 50 | 10 days supply | Qty: 30 | Fill #0

## 2017-10-12 MED FILL — metroNIDAZOLE 500 MG TABS: 500 | 7 days supply | Qty: 14 | Fill #0

## 2017-10-12 MED FILL — FLUCONAZOLE 150 MG TABLET: 150 | 3 days supply | Qty: 2 | Fill #0

## 2017-10-12 MED FILL — raNITIdine HCL 150 MG TABS: 150 | 90 days supply | Qty: 180 | Fill #2

## 2017-10-12 NOTE — Progress Notes (Signed)
Chief Complaint  Patient presents with  . ? Kidney stone    right flank low back pain x 3 days, pain 8/10    HPI  Pt reports that Saturday morning she started having a dull ache with increased urination  She reports that her pain in all in the right flank  She states that she had intermittent nausea She states that she got dizzy 4 days ago and had to get zofran due to the dizziness and nausea She states that she has been drinking plenty of fluids and noted decreased urination She had a left side kidney stone at age 52  She denies recent injury and has no falls Denies vaginal discharge Recent std screening negative No constipation   Past Medical History:  Diagnosis Date  . Asthma   . COPD (chronic obstructive pulmonary disease) ([place])   . Fibroid   . Genital warts   . Hypertension   . PID (pelvic inflammatory disease)   . Renal disorder     Current Outpatient Prescriptions  Medication Sig Dispense Refill  . acetaminophen (TYLENOL) 500 MG tablet Take 1,000 mg by mouth every 6 (six) hours as needed for headache.    Marland Kitchen acyclovir (ZOVIRAX) 400 MG tablet Take 400 mg by mouth as needed (For fever blisters.).    Marland Kitchen albuterol (PROAIR HFA) 108 (90 Base) MCG/ACT inhaler Inhale 2 puffs into the lungs every 4 (four) hours as needed for wheezing or shortness of breath. 1 Inhaler 1  . amLODipine (NORVASC) 5 MG tablet Take 5 mg by mouth daily.    . budesonide-formoterol (SYMBICORT) 160-4.5 MCG/ACT inhaler Take 2 puffs first thing in am and then another 2 puffs about 12 hours later. 1 Inhaler 3  . butalbital-acetaminophen-caffeine (FIORICET, ESGIC) 50-325-40 MG tablet Take 0.5 tablets by mouth 2 (two) times daily as needed for headache. 14 tablet 2  . cetirizine (ZYRTEC ALLERGY) 10 MG tablet Take 10 mg by mouth daily.    Marland Kitchen gabapentin (NEURONTIN) 300 MG capsule Take 1 capsule (300 mg total) by mouth at bedtime. 90 capsule 1  . ipratropium (ATROVENT) 0.02 % nebulizer solution Take 2.5 mLs  (0.5 mg total) by nebulization 4 (four) times daily. 75 mL 1  . ipratropium (ATROVENT) 0.03 % nasal spray Place 2 sprays into both nostrils 2 (two) times daily. 30 mL 0  . montelukast (SINGULAIR) 10 MG tablet Take 1 tablet (10 mg total) by mouth at bedtime. 90 tablet 3  . ranitidine (ZANTAC) 150 MG tablet Take 150 mg by mouth 2 (two) times daily.    Marland Kitchen doxycycline (VIBRAMYCIN) 100 MG capsule Take 1 capsule (100 mg total) by mouth 2 (two) times daily. (Patient not taking: Reported on 10/12/2017) 20 capsule 0  . traMADol (ULTRAM) 50 MG tablet Take 1 tablet (50 mg total) by mouth every 8 (eight) hours as needed. 30 tablet 0   No current facility-administered medications for this visit.     Allergies:  Allergies  Allergen Reactions  . Septra [Sulfamethoxazole-Trimethoprim] Hives, Swelling and Rash  . Aspirin Other (See Comments)    Asthma flare up - wheezing  . Cantaloupe (Diagnostic) Itching and Other (See Comments)    Makes tongue itch  . Kiwi Extract Itching and Other (See Comments)    Makes tongue itch  . Nsaids Other (See Comments)    Asthma flare up - wheezing   . Other Other (See Comments)    Pecans and walnuts---induce asthma attack   . Pineapple Other (See Comments)  Tongue bleeds  . Pyridium [Phenazopyridine Hcl] Itching    Itching, swelling and burning    Past Surgical History:  Procedure Laterality Date  . COLPOSCOPY    . DILATION AND CURETTAGE OF UTERUS    . LAPAROSCOPIC TOTAL HYSTERECTOMY    . laser genital warts      Social History   Social History  . Marital status: Married    Spouse name: N/A  . Number of children: N/A  . Years of education: N/A   Social History Main Topics  . Smoking status: Never Smoker  . Smokeless tobacco: Never Used  . Alcohol use 0.6 - 1.8 oz/week    1 - 3 Glasses of wine per week  . Drug use: No  . Sexual activity: Yes    Partners: Male    Birth control/ protection: Surgical     Comment: hysterectomy   Other Topics  Concern  . Not on file   Social History Narrative  . No narrative on file    Review of Systems  Constitutional: Negative for chills, diaphoresis, fever, malaise/fatigue and weight loss.  HENT: Negative for hearing loss and tinnitus.   Respiratory: Negative for cough, shortness of breath and wheezing.   Cardiovascular: Negative for chest pain and palpitations.  Gastrointestinal: Positive for nausea. Negative for abdominal pain and vomiting.  Genitourinary: Positive for flank pain and urgency. Negative for hematuria.  Musculoskeletal: Positive for myalgias.  Neurological: Negative for dizziness, tingling, weakness and headaches.    Objective: There were no vitals filed for this visit.  Physical Exam  Constitutional: She is oriented to person, place, and time. She appears well-developed and well-nourished.  HENT:  Head: Normocephalic and atraumatic.  Eyes: Conjunctivae and EOM are normal.  Cardiovascular: Normal rate, regular rhythm and normal heart sounds.   Pulmonary/Chest: Effort normal and breath sounds normal. No respiratory distress. She has no wheezes. She has no rales.  Abdominal: Soft. Bowel sounds are normal. She exhibits no distension and no mass. There is no tenderness. There is no rebound and no guarding. No hernia.  Neurological: She is alert and oriented to person, place, and time.  Skin: Skin is warm. Capillary refill takes less than 2 seconds.  Psychiatric: She has a normal mood and affect. Her behavior is normal. Judgment and thought content normal.    Tender to palpation over the right CVA No suprapubic pain No groin pain with palpation over the inguinal canal No SI joint pain Straight leg raise test negative bilaterally  CT RENAL STONE 2017 Showed no stones  ABD Plain film No stones, normal gas pattern  Assessment and Plan Lillyann was seen today for ? kidney stone.  Diagnoses and all orders for this visit:  Acute right-sided low back pain without  sciatica -     POCT urinalysis dipstick -     Urine Culture -     POCT CBC -     DG Abd 1 View -     Basic metabolic panel  Right lower quadrant abdominal pain -     CT RENAL STONE STUDY  Other orders -     Cancel: Tdap vaccine greater than or equal to 7yo IM -     Discontinue: tamsulosin (FLOMAX) 0.4 MG CAPS capsule; Take 1 capsule (0.4 mg total) by mouth daily. -     traMADol (ULTRAM) 50 MG tablet; Take 1 tablet (50 mg total) by mouth every 8 (eight) hours as needed.   Discussed that although the stone might be  too small to be seen on plain film signs of ureteral obstruction can be seen Will check for leukocytosis to see if Pyelonephritis is a concern Currently patient is afebrile Will also get bmp so that we know creatinine in anticipation of pain management Urine dipstick was negative currently for infection Pt unable to tolerate SULFER drugs so flomax was discontinued Will give tramadol for pain  Will check CT renal stone   A total of 32 minutes were spent face-to-face with the patient during this encounter and over half of that time was spent on counseling and coordination of care.  [place]

## 2017-10-12 NOTE — Patient Instructions (Addendum)
     IF you received an x-ray today, you will receive an invoice from [facility]. Please contact [facility] at [phone] with questions or concerns regarding your invoice.   IF you received labwork today, you will receive an invoice from [place]. Please contact [facility] at [phone] with questions or concerns regarding your invoice.   Our billing staff will not be able to assist you with questions regarding bills from these companies.  You will be contacted with the lab results as soon as they are available. The fastest way to get your results is to activate your My Chart account. Instructions are located on the last page of this paperwork. If you have not heard from [place] regarding the results in 2 weeks, please contact this office.     Flank Pain, Adult Flank pain is pain that is located on the side of the body between the upper abdomen and the back. This area is called the flank. The pain may occur over a short period of time (acute), or it may be long-term or recurring (chronic). It may be mild or severe. Flank pain can be caused by many things, including:  Muscle soreness or injury.  Kidney stones or kidney disease.  Stress.  A disease of the spine (vertebral disk disease).  A lung infection (pneumonia).  Fluid around the lungs (pulmonary edema).  A skin rash caused by the chickenpox virus (shingles).  Tumors that affect the back of the abdomen.  Gallbladder disease.  Follow these instructions at home:  Drink enough fluid to keep your urine clear or pale yellow.  Rest as told by your health care provider.  Take over-the-counter and prescription medicines only as told by your health care provider.  Keep a journal to track what has caused your flank pain and what has made it feel better.  Keep all follow-up visits as told by your health care provider. This is important. Contact a health care provider if:  Your pain is not controlled with  medicine.  You have new symptoms.  Your pain gets worse.  You have a fever.  Your symptoms last longer than 2-3 days.  You have trouble urinating or you are urinating very frequently. Get help right away if:  You have trouble breathing or you are short of breath.  Your abdomen hurts or it is swollen or red.  You have nausea or vomiting.  You feel faint or you pass out.  You have blood in your urine. Summary  Flank pain is pain that is located on the side of the body between the upper abdomen and the back.  The pain may occur over a short period of time (acute), or it may be long-term or recurring (chronic). It may be mild or severe.  Flank pain can be caused by many things.  Contact your health care provider if your symptoms get worse or they last longer than 2-3 days. This information is not intended to replace advice given to you by your health care provider. Make sure you discuss any questions you have with your health care provider. Document Released: 02/05/2006 Document Revised: 02/27/2017 Document Reviewed: 02/27/2017 Elsevier Interactive Patient Education  2018 [employer].

## 2017-10-13 ENCOUNTER — Ambulatory Visit (HOSPITAL_COMMUNITY)
Admission: RE | Admit: 2017-10-13 | Discharge: 2017-10-13 | Disposition: A | Discharge status: Home / Self Care | Payer: 59 | Source: Ambulatory Visit | Attending: Family Medicine | Admitting: Family Medicine

## 2017-10-13 ENCOUNTER — Encounter (HOSPITAL_COMMUNITY): Payer: Self-pay

## 2017-10-13 DIAGNOSIS — R9341 Abnormal radiologic findings on diagnostic imaging of renal pelvis, ureter, or bladder: Secondary | ICD-10-CM | POA: Insufficient documentation

## 2017-10-13 DIAGNOSIS — R109 Unspecified abdominal pain: Secondary | ICD-10-CM | POA: Diagnosis not present

## 2017-10-13 DIAGNOSIS — Z90710 Acquired absence of both cervix and uterus: Secondary | ICD-10-CM | POA: Diagnosis not present

## 2017-10-13 DIAGNOSIS — R1031 Right lower quadrant pain: Secondary | ICD-10-CM | POA: Diagnosis not present

## 2017-10-13 LAB — BASIC METABOLIC PANEL
BUN / CREAT RATIO: 14 (ref 9–23)
BUN: 10 mg/dL (ref 6–24)
CO2: 23 mmol/L (ref 20–29)
Calcium: 9.2 mg/dL (ref 8.7–10.2)
Chloride: 105 mmol/L (ref 96–106)
Creatinine, Ser: 0.74 mg/dL (ref 0.57–1.00)
GFR calc Af Amer: 112 mL/min/{1.73_m2} (ref >59)
GFR, EST NON AFRICAN AMERICAN: 97 mL/min/{1.73_m2} (ref >59)
GLUCOSE: 81 mg/dL (ref 65–99)
POTASSIUM: 4 mmol/L (ref 3.5–5.2)
SODIUM: 142 mmol/L (ref 134–144)

## 2017-10-13 LAB — URINE CULTURE

## 2017-11-11 ENCOUNTER — Telehealth: Payer: Self-pay

## 2017-11-17 ENCOUNTER — Telehealth: Payer: Self-pay | Admitting: Family Medicine

## 2017-11-17 DIAGNOSIS — M21619 Bunion of unspecified foot: Secondary | ICD-10-CM

## 2017-11-17 NOTE — Telephone Encounter (Signed)
Pt requesting referral to podiatrist. Please advise. Thanks!

## 2017-11-17 NOTE — Telephone Encounter (Signed)
Referral has been placed. 

## 2017-11-27 ENCOUNTER — Ambulatory Visit (INDEPENDENT_AMBULATORY_CARE_PROVIDER_SITE_OTHER): Payer: 59

## 2017-11-27 ENCOUNTER — Ambulatory Visit (INDEPENDENT_AMBULATORY_CARE_PROVIDER_SITE_OTHER): Payer: 59 | Admitting: Podiatry

## 2017-11-27 ENCOUNTER — Encounter: Payer: Self-pay | Admitting: Podiatry

## 2017-11-27 VITALS — BP 123/79 | HR 77 | Ht 69.0 in | Wt 201.0 lb

## 2017-11-27 DIAGNOSIS — B351 Tinea unguium: Secondary | ICD-10-CM

## 2017-11-27 DIAGNOSIS — M21619 Bunion of unspecified foot: Secondary | ICD-10-CM

## 2017-11-27 DIAGNOSIS — M79609 Pain in unspecified limb: Secondary | ICD-10-CM | POA: Diagnosis not present

## 2017-11-27 DIAGNOSIS — Z79899 Other long term (current) drug therapy: Secondary | ICD-10-CM | POA: Diagnosis not present

## 2017-11-27 MED ORDER — TERBINAFINE HCL 250 MG PO TABS: 250.0000 mg | ORAL_TABLET | Freq: Every day | ORAL | 0 refills | Status: DC

## 2017-11-27 NOTE — Progress Notes (Signed)
Subjective:  Patient ID: Deborah Cordova, female    DOB: 05-Jun-1971,  MRN: [id number]  Chief Complaint  Patient presents with  . Bunions    Left - painful in all shoes  . Ingrown Toenail    bilateral great toes   46 y.o. female presents with the above complaint.  Reports painful left foot bunion.  States that it is worse when she is wearing shoes.  Has tried different shoe gear options without relief.  Also reports ingrown toenails to bilateral great toes with toenail discoloration  Past Medical History:  Diagnosis Date  . Asthma   . COPD (chronic obstructive pulmonary disease) ([place])   . Fibroid   . Genital warts   . Hypertension   . PID (pelvic inflammatory disease)   . Renal disorder    Past Surgical History:  Procedure Laterality Date  . COLPOSCOPY    . DILATION AND CURETTAGE OF UTERUS    . LAPAROSCOPIC TOTAL HYSTERECTOMY    . laser genital warts      Current Outpatient Medications:  .  acetaminophen (TYLENOL) 500 MG tablet, Take 1,000 mg by mouth every 6 (six) hours as needed for headache., Disp: , Rfl:  .  acyclovir (ZOVIRAX) 400 MG tablet, Take 400 mg by mouth as needed (For fever blisters.)., Disp: , Rfl:  .  albuterol (PROAIR HFA) 108 (90 Base) MCG/ACT inhaler, Inhale 2 puffs into the lungs every 4 (four) hours as needed for wheezing or shortness of breath., Disp: 3 Inhaler, Rfl: 0 .  amLODipine (NORVASC) 5 MG tablet, Take 5 mg by mouth daily., Disp: , Rfl:  .  budesonide-formoterol (SYMBICORT) 160-4.5 MCG/ACT inhaler, Take 2 puffs first thing in am and then another 2 puffs about 12 hours later. (Patient not taking: Reported on 11/27/2017), Disp: 3 Inhaler, Rfl: 1 .  butalbital-acetaminophen-caffeine (FIORICET, ESGIC) 50-325-40 MG tablet, Take 0.5 tablets by mouth 2 (two) times daily as needed for headache. (Patient not taking: Reported on 11/27/2017), Disp: 14 tablet, Rfl: 2 .  cetirizine (ZYRTEC ALLERGY) 10 MG tablet, Take 10 mg by mouth daily., Disp: , Rfl:  .   gabapentin (NEURONTIN) 300 MG capsule, Take 1 capsule (300 mg total) by mouth at bedtime., Disp: 90 capsule, Rfl: 1 .  ipratropium (ATROVENT) 0.02 % nebulizer solution, Take 2.5 mLs (0.5 mg total) by nebulization 4 (four) times daily. (Patient not taking: Reported on 11/27/2017), Disp: 75 mL, Rfl: 1 .  ipratropium (ATROVENT) 0.03 % nasal spray, Place 2 sprays into both nostrils 2 (two) times daily. (Patient not taking: Reported on 11/27/2017), Disp: 30 mL, Rfl: 0 .  montelukast (SINGULAIR) 10 MG tablet, Take 1 tablet (10 mg total) by mouth at bedtime., Disp: 90 tablet, Rfl: 3 .  ranitidine (ZANTAC) 150 MG tablet, Take 150 mg by mouth 2 (two) times daily., Disp: , Rfl:  .  terbinafine (LAMISIL) 250 MG tablet, Take 1 tablet (250 mg total) by mouth daily., Disp: 30 tablet, Rfl: 0 .  traMADol (ULTRAM) 50 MG tablet, Take 1 tablet (50 mg total) by mouth every 8 (eight) hours as needed. (Patient not taking: Reported on 11/27/2017), Disp: 30 tablet, Rfl: 0  Allergies  Allergen Reactions  . Septra [Sulfamethoxazole-Trimethoprim] Hives, Swelling and Rash  . Aspirin Other (See Comments)    Asthma flare up - wheezing  . Cantaloupe (Diagnostic) Itching and Other (See Comments)    Makes tongue itch  . Kiwi Extract Itching and Other (See Comments)    Makes tongue itch  . Nsaids Other (See  Comments)    Asthma flare up - wheezing   . Other Other (See Comments)    Pecans and walnuts---induce asthma attack   . Pineapple Other (See Comments)    Tongue bleeds  . Pyridium [Phenazopyridine Hcl] Itching    Itching, swelling and burning   Review of Systems Objective:   Vitals:   11/27/17 1121  BP: 123/79  Pulse: 77   General AA&O x3. Normal mood and affect.  Vascular Dorsalis pedis and posterior tibial pulses  present 2+ bilaterally. Capillary refill normal to all digits. Pedal hair growth normal.  Neurologic Epicritic sensation grossly intact.  Dermatologic No open lesions. Interspaces clear of  maceration.  Normal skin temperature and turgor. Hyperkeratotic lesions: None bilaterally  Orthopedic: MMT 5/5 in dorsiflexion, plantarflexion, inversion, and eversion. Hallux abductovalgus deformity Present left Left 1st MPJ full range of motion. Left 1st TMT without gross hypermobility. Right 1st MPJ full range of motion  Right 1st TMT without gross hypermobility. Lesser digital contractures present bilaterally.   Radiographs: Taken and reviewed. Hallux abductovalgus deformity present.  No acute fractures dislocations.  Mild HAV deformity.  Mild HIA deformity  Assessment & Plan:  Patient was evaluated and treated and all questions answered.  Bunion left foot -X-rays reviewed as above -Discussed conservative versus surgical treatment options with patient.  Patient is failed all conservative therapy and wishes to proceed with surgical intervention.  All risk benefits alternatives reviewed with patient no guarantees given.  Discussed with patient the postoperative course.  Patient wishes to undergo surgical intervention.  Consent form reviewed and signed.  Plan for date in January for bunion correction -Will dispense boot prior to surgery.  Onychomycosis -Sample taken for pathology/histology -LFTs taken today.  Start Lamisil therapy.  Will recheck in 4 weeks at next visit  Return in about 4 weeks (around 12/25/2017) for nail fungus f/u .

## 2017-11-28 ENCOUNTER — Telehealth: Payer: Self-pay | Admitting: Family Medicine

## 2017-11-28 ENCOUNTER — Other Ambulatory Visit: Payer: Self-pay | Admitting: Family Medicine

## 2017-11-28 ENCOUNTER — Ambulatory Visit (INDEPENDENT_AMBULATORY_CARE_PROVIDER_SITE_OTHER): Payer: 59 | Admitting: Family Medicine

## 2017-11-28 DIAGNOSIS — B351 Tinea unguium: Secondary | ICD-10-CM

## 2017-11-28 NOTE — Telephone Encounter (Signed)
Patient needs hepatic panel due to Lamisil therapy per Dr. March Rummage of podiatry.  Order placed; chart reviewed.

## 2017-11-29 LAB — HEPATIC FUNCTION PANEL
ALBUMIN: 4.2 g/dL (ref 3.5–5.5)
ALT: 12 IU/L (ref 0–32)
AST: 7 IU/L (ref 0–40)
Alkaline Phosphatase: 56 IU/L (ref 39–117)
BILIRUBIN TOTAL: 0.6 mg/dL (ref 0.0–1.2)
Bilirubin, Direct: 0.17 mg/dL (ref 0.00–0.40)
Total Protein: 6.9 g/dL (ref 6.0–8.5)

## 2017-12-01 NOTE — Progress Notes (Signed)
Lab only visit 

## 2017-12-14 ENCOUNTER — Ambulatory Visit (INDEPENDENT_AMBULATORY_CARE_PROVIDER_SITE_OTHER): Payer: 59 | Admitting: Physician Assistant

## 2017-12-14 ENCOUNTER — Encounter: Payer: Self-pay | Admitting: Physician Assistant

## 2017-12-14 ENCOUNTER — Ambulatory Visit: Payer: 59 | Admitting: Family Medicine

## 2017-12-14 ENCOUNTER — Other Ambulatory Visit: Payer: Self-pay | Admitting: Family Medicine

## 2017-12-14 VITALS — BP 130/76 | HR 62 | Temp 98.2°F | Resp 18 | Wt 209.8 lb

## 2017-12-14 DIAGNOSIS — J45998 Other asthma: Secondary | ICD-10-CM | POA: Diagnosis not present

## 2017-12-14 DIAGNOSIS — J4541 Moderate persistent asthma with (acute) exacerbation: Secondary | ICD-10-CM

## 2017-12-14 MED ORDER — PREDNISONE 50 MG PO TABS: ORAL_TABLET | ORAL | 0 refills | Status: DC

## 2017-12-14 MED ORDER — FLUTICASONE PROPIONATE HFA 44 MCG/ACT IN AERO: 2.0000 | INHALATION_SPRAY | RESPIRATORY_TRACT | 12 refills | Status: DC

## 2017-12-14 MED ORDER — ALBUTEROL SULFATE (2.5 MG/3ML) 0.083% IN NEBU: 2.5000 mg | INHALATION_SOLUTION | Freq: Once | RESPIRATORY_TRACT | Status: DC

## 2017-12-14 MED ORDER — IPRATROPIUM BROMIDE 0.02 % IN SOLN
0.5000 mg | Freq: Once | RESPIRATORY_TRACT | Status: AC
Start: 2017-12-14 — End: 2017-12-14
  Administered 2017-12-14: 0.5 mg via RESPIRATORY_TRACT

## 2017-12-14 MED ORDER — METHYLPREDNISOLONE SODIUM SUCC 125 MG IJ SOLR
62.5000 mg | Freq: Once | INTRAMUSCULAR | Status: AC
  Administered 2017-12-14: 62.5 mg via INTRAVENOUS

## 2017-12-14 MED ORDER — ALBUTEROL SULFATE (2.5 MG/3ML) 0.083% IN NEBU
2.5000 mg | INHALATION_SOLUTION | Freq: Once | RESPIRATORY_TRACT | Status: AC
  Administered 2017-12-14: 2.5 mg via RESPIRATORY_TRACT

## 2017-12-14 MED ORDER — AMLODIPINE BESYLATE 5 MG PO TABS: 5.0000 mg | ORAL_TABLET | Freq: Every day | ORAL | 3 refills | Status: AC

## 2017-12-14 MED ORDER — IPRATROPIUM BROMIDE 0.02 % IN SOLN: 0.5000 mg | Freq: Once | RESPIRATORY_TRACT | Status: DC

## 2017-12-14 MED ORDER — BUDESONIDE-FORMOTEROL FUMARATE 160-4.5 MCG/ACT IN AERO: INHALATION_SPRAY | RESPIRATORY_TRACT | 1 refills | Status: DC

## 2017-12-14 NOTE — Patient Instructions (Signed)
Call you allergist and ask if they would be willing to start you on Xolair given that you are still having asthma exacerbations after starting symbicort.

## 2017-12-14 NOTE — Progress Notes (Signed)
     IF you received an x-ray today, you will receive an invoice from [facility]. Please contact [facility] at [phone] with questions or concerns regarding your invoice.   IF you received labwork today, you will receive an invoice from [facility]. Please contact [facility] at [phone] with questions or concerns regarding your invoice.   Our billing staff will not be able to assist you with questions regarding bills from these companies.  You will be contacted with the lab results as soon as they are available. The fastest way to get your results is to activate your My Chart account. Instructions are located on the last page of this paperwork. If you have not heard from us regarding the results in 2 weeks, please contact this office.     

## 2017-12-14 NOTE — Addendum Note (Signed)
Addended by: Tereasa Coop on: 12/14/2017 05:20 PM   Modules accepted: Orders

## 2017-12-14 NOTE — Progress Notes (Signed)
12/14/2017 12:16 PM   DOB: 12-Jan-1971 / MRN: [id number]  SUBJECTIVE:  Deborah Cordova is a 46 y.o. female presenting for chest tightness.  Has a terrible history of asthma with multiple hospitalizations.  Has been doing much better since starting the symbicort.  She has tried to have Dr. Melvyn Novas start her on Xolair which worked beautifully for her in the past however he was not willing to do this.  She has seen allergy for allergy testing and does the best she can to avoid triggers.  She is taking symbicort, has a rescue inhaler, takes zyrtec and Singulair daily without fail.  This is her first exacerbation in about 6 months.  No history of diabetes.   Immunization History  Administered Date(s) Administered  . Influenza Split 09/30/2006, 10/02/2015  . Influenza Whole 09/28/2016     She is allergic to septra [sulfamethoxazole-trimethoprim]; aspirin; cantaloupe (diagnostic); kiwi extract; nsaids; other; pineapple; and pyridium [phenazopyridine hcl].   She  has a past medical history of Asthma, COPD (chronic obstructive pulmonary disease) ([place]), Fibroid, Genital warts, Hypertension, PID (pelvic inflammatory disease), and Renal disorder.    She  reports that  has never smoked. she has never used smokeless tobacco. She reports that she drinks about 0.6 - 1.8 oz of alcohol per week. She reports that she does not use drugs. She  reports that she currently engages in sexual activity and has had partners who are Female. She reports using the following method of birth control/protection: Surgical. The patient  has a past surgical history that includes Dilation and curettage of uterus; Colposcopy; laser genital warts; and Laparoscopic total hysterectomy.  Her family history includes Asthma in her brother, mother, and sister; Breast cancer in her sister; COPD in her father; Cancer in her maternal grandfather, paternal grandfather, and paternal grandmother; Depression in her mother; Diabetes in her brother and  mother; Emphysema in her father; Hepatitis C in her father and mother; Hyperlipidemia in her brother; Hypertension in her mother; Stroke in her maternal grandmother.  Review of Systems  Constitutional: Negative for chills, diaphoresis and fever.  Respiratory: Positive for shortness of breath and wheezing. Negative for cough, hemoptysis and sputum production.   Cardiovascular: Negative for chest pain, orthopnea and leg swelling.  Gastrointestinal: Negative for nausea.  Skin: Negative for rash.  Neurological: Negative for dizziness.    The problem list and medications were reviewed and updated by myself where necessary and exist elsewhere in the encounter.   OBJECTIVE:  BP 130/76 (BP Location: Right Arm, Patient Position: Sitting, Cuff Size: Large)   Pulse 62   Temp 98.2 F (36.8 C) (Oral)   Resp 18   Wt 209 lb 12.8 oz (95.2 kg)   LMP 03/29/2016 (Approximate)   SpO2 97%   PF 250 L/min Comment: post 1-300  2-330 3-310  BMI 30.98 kg/m   Physical Exam  Constitutional: She is active.  Non-toxic appearance.  Cardiovascular: Normal rate, regular rhythm, S1 normal, S2 normal, normal heart sounds and intact distal pulses. Exam reveals no gallop, no friction rub and no decreased pulses.  No murmur heard. Pulmonary/Chest: Effort normal. No stridor. No tachypnea. No respiratory distress. She has wheezes (persistent in the upper airways x1 neb.  ). She has no rales. She exhibits no tenderness.  Abdominal: She exhibits no distension.  Musculoskeletal: She exhibits no edema.  Neurological: She is alert.  Skin: Skin is warm and dry. She is not diaphoretic. No pallor.    No results found for this or  any previous visit (from the past 72 hour(s)).  No results found.  ASSESSMENT AND PLAN:  Marveline was seen today for cough.  Diagnoses and all orders for this visit:  Asthma, persistent not controlled -     albuterol (PROVENTIL) (2.5 MG/3ML) 0.083% nebulizer solution 2.5 mg -     ipratropium  (ATROVENT) nebulizer solution 0.5 mg -     methylPREDNISolone sodium succinate (SOLU-MEDROL) 125 mg/2 mL injection 62.5 mg  Moderate persistent asthma with acute exacerbation -     budesonide-formoterol (SYMBICORT) 160-4.5 MCG/ACT inhaler; Take 2 puffs first thing in am and then another 2 puffs about 12 hours later.  Other orders -     predniSONE (DELTASONE) 50 MG tablet; Take one tab daily in the morning. -     Discontinue: fluticasone (FLOVENT HFA) 44 MCG/ACT inhaler; Inhale 2 puffs into the lungs every morning. Rinse out mouth after each use.    The patient is advised to call or return to clinic if she does not see an improvement in symptoms, or to seek the care of the closest emergency department if she worsens with the above plan.   Philis Fendt, MHS, PA-C [facility] 12/14/2017 12:16 PM

## 2017-12-17 ENCOUNTER — Other Ambulatory Visit: Payer: Self-pay | Admitting: Family Medicine

## 2017-12-17 MED ORDER — GENTAMICIN SULFATE 0.3 % OP SOLN: 1.0000 [drp] | OPHTHALMIC | 0 refills | Status: DC

## 2017-12-17 NOTE — Progress Notes (Signed)
Left eye with mild erythema, lid edema, itching, gritty.

## 2017-12-19 ENCOUNTER — Ambulatory Visit (INDEPENDENT_AMBULATORY_CARE_PROVIDER_SITE_OTHER): Payer: 59 | Admitting: Physician Assistant

## 2017-12-19 ENCOUNTER — Ambulatory Visit (INDEPENDENT_AMBULATORY_CARE_PROVIDER_SITE_OTHER): Payer: 59

## 2017-12-19 ENCOUNTER — Encounter: Payer: Self-pay | Admitting: Physician Assistant

## 2017-12-19 VITALS — BP 136/81 | HR 87 | Temp 98.0°F | Resp 20 | Ht 68.0 in | Wt 213.0 lb

## 2017-12-19 DIAGNOSIS — R7982 Elevated C-reactive protein (CRP): Secondary | ICD-10-CM

## 2017-12-19 DIAGNOSIS — M79645 Pain in left finger(s): Secondary | ICD-10-CM

## 2017-12-19 NOTE — Patient Instructions (Signed)
     IF you received an x-ray today, you will receive an invoice from [facility]. Please contact [facility] at [phone] with questions or concerns regarding your invoice.   IF you received labwork today, you will receive an invoice from [facility]. Please contact [facility] at [phone] with questions or concerns regarding your invoice.   Our billing staff will not be able to assist you with questions regarding bills from these companies.  You will be contacted with the lab results as soon as they are available. The fastest way to get your results is to activate your My Chart account. Instructions are located on the last page of this paperwork. If you have not heard from us regarding the results in 2 weeks, please contact this office.     

## 2017-12-19 NOTE — Progress Notes (Signed)
12/19/2017 4:06 PM   DOB: February 11, 1971 / MRN: [id number]  SUBJECTIVE:  Deborah Cordova is a 46 y.o. female presenting for hand pain that started after accidentally slamming the finger on a marble counter top about 3 weeks ago.  She associates some swelling.  Feels no better or worse.  Does not take NSAIDS due to risk of side effects.   She is allergic to septra [sulfamethoxazole-trimethoprim]; aspirin; cantaloupe (diagnostic); kiwi extract; nsaids; other; pineapple; and pyridium [phenazopyridine hcl].   She  has a past medical history of Asthma, COPD (chronic obstructive pulmonary disease) ([place]), Fibroid, Genital warts, Hypertension, PID (pelvic inflammatory disease), and Renal disorder.    She  reports that  has never smoked. she has never used smokeless tobacco. She reports that she drinks about 0.6 - 1.8 oz of alcohol per week. She reports that she does not use drugs. She  reports that she currently engages in sexual activity and has had partners who are Female. She reports using the following method of birth control/protection: Surgical. The patient  has a past surgical history that includes Dilation and curettage of uterus; Colposcopy; laser genital warts; and Laparoscopic total hysterectomy.  Her family history includes Asthma in her brother, mother, and sister; Breast cancer in her sister; COPD in her father; Cancer in her maternal grandfather, paternal grandfather, and paternal grandmother; Depression in her mother; Diabetes in her brother and mother; Emphysema in her father; Hepatitis C in her father and mother; Hyperlipidemia in her brother; Hypertension in her mother; Stroke in her maternal grandmother.  Review of Systems  Musculoskeletal: Positive for joint pain. Negative for back pain, falls, myalgias and neck pain.    The problem list and medications were reviewed and updated by myself where necessary and exist elsewhere in the encounter.   OBJECTIVE:  LMP 03/29/2016 (Approximate)     Physical Exam  Constitutional: She is oriented to person, place, and time. She is active.  Non-toxic appearance.  HENT:  Right Ear: Hearing, tympanic membrane, external ear and ear canal normal.  Left Ear: Hearing, tympanic membrane, external ear and ear canal normal.  Nose: Nose normal. Right sinus exhibits no maxillary sinus tenderness and no frontal sinus tenderness. Left sinus exhibits no maxillary sinus tenderness and no frontal sinus tenderness.  Mouth/Throat: Uvula is midline, oropharynx is clear and moist and mucous membranes are normal. Mucous membranes are not dry. No oropharyngeal exudate, posterior oropharyngeal edema or tonsillar abscesses.  Eyes: EOM are normal. Pupils are equal, round, and reactive to light.  Cardiovascular: Normal rate, regular rhythm, S1 normal, S2 normal, normal heart sounds and intact distal pulses. Exam reveals no gallop, no friction rub and no decreased pulses.  No murmur heard. Pulmonary/Chest: Effort normal. No stridor. No tachypnea. No respiratory distress. She has no wheezes. She has no rales.  Abdominal: She exhibits no distension.  Musculoskeletal: She exhibits no edema.  Lymphadenopathy:       Head (right side): No submandibular and no tonsillar adenopathy present.       Head (left side): No submandibular and no tonsillar adenopathy present.    She has no cervical adenopathy.  Neurological: She is alert and oriented to person, place, and time. She has normal strength and normal reflexes. She is not disoriented. She displays no atrophy. No cranial nerve deficit or sensory deficit. She exhibits normal muscle tone. Coordination and gait normal.  Skin: Skin is warm and dry. She is not diaphoretic. No pallor.  Psychiatric: Her behavior is normal.  No results found for this or any previous visit (from the past 72 hour(s)).  Dg Finger Index Left  Result Date: 12/19/2017 CLINICAL DATA:  Pain involving the PIP joint of the left index finger of  unspecified chronicity. EXAM: LEFT INDEX FINGER 2+V COMPARISON:  Left hand x-ray 10/05/2017. FINDINGS: No evidence of acute fracture or dislocation. Joint spaces well preserved. Well-preserved bone mineral density. No intrinsic osseous abnormalities. IMPRESSION: Normal examination. Electronically Signed   By: Evangeline Dakin M.D.   On: 12/19/2017 15:42    ASSESSMENT AND PLAN:  Diagnoses and all orders for this visit:  Finger pain, left: Finger appears normal to me with normal function.  Her mother has a history of RA.  Given the location of the pain and her family histery will screen a sed rate and CRP despite history of recent trauma.  -     DG Finger Index Left; Future -     Sedimentation Rate -     C-reactive protein    The patient is advised to call or return to clinic if she does not see an improvement in symptoms, or to seek the care of the closest emergency department if she worsens with the above plan.   Philis Fendt, MHS, PA-C [facility] 12/19/2017 4:06 PM

## 2017-12-21 ENCOUNTER — Ambulatory Visit (INDEPENDENT_AMBULATORY_CARE_PROVIDER_SITE_OTHER): Payer: 59 | Admitting: Physician Assistant

## 2017-12-21 ENCOUNTER — Ambulatory Visit (INDEPENDENT_AMBULATORY_CARE_PROVIDER_SITE_OTHER): Payer: 59

## 2017-12-21 VITALS — BP 145/73 | HR 85 | Temp 98.8°F | Resp 16 | Ht 68.0 in | Wt 211.0 lb

## 2017-12-21 DIAGNOSIS — R05 Cough: Secondary | ICD-10-CM | POA: Diagnosis not present

## 2017-12-21 DIAGNOSIS — J4551 Severe persistent asthma with (acute) exacerbation: Secondary | ICD-10-CM

## 2017-12-21 DIAGNOSIS — R059 Cough, unspecified: Secondary | ICD-10-CM

## 2017-12-21 MED ORDER — PREDNISONE 20 MG PO TABS: ORAL_TABLET | ORAL | 0 refills | Status: DC

## 2017-12-21 MED ORDER — FLUCONAZOLE 150 MG PO TABS: 150.0000 mg | ORAL_TABLET | Freq: Once | ORAL | 0 refills | Status: AC

## 2017-12-21 MED ORDER — METHYLPREDNISOLONE SODIUM SUCC 125 MG IJ SOLR
62.5000 mg | Freq: Once | INTRAMUSCULAR | Status: AC
  Administered 2017-12-21: 62.5 mg via INTRAMUSCULAR

## 2017-12-21 MED ORDER — IPRATROPIUM BROMIDE 0.02 % IN SOLN
0.5000 mg | Freq: Once | RESPIRATORY_TRACT | Status: AC
  Administered 2017-12-21: 0.5 mg via RESPIRATORY_TRACT

## 2017-12-21 MED ORDER — DOXYCYCLINE HYCLATE 100 MG PO CAPS: 100.0000 mg | ORAL_CAPSULE | Freq: Two times a day (BID) | ORAL | 0 refills | Status: AC

## 2017-12-21 MED ORDER — ALBUTEROL SULFATE (2.5 MG/3ML) 0.083% IN NEBU
2.5000 mg | INHALATION_SOLUTION | Freq: Once | RESPIRATORY_TRACT | Status: AC
  Administered 2017-12-21: 2.5 mg via RESPIRATORY_TRACT

## 2017-12-21 MED ORDER — BUDESONIDE 1 MG/2ML IN SUSP: RESPIRATORY_TRACT | 0 refills | Status: DC

## 2017-12-21 MED ORDER — BUDESONIDE 0.5 MG/2ML IN SUSP: 1.5000 mg | Freq: Four times a day (QID) | RESPIRATORY_TRACT | 0 refills | Status: DC

## 2017-12-21 MED ORDER — METHYLPREDNISOLONE SODIUM SUCC 125 MG IJ SOLR: 62.5000 mg | Freq: Once | INTRAMUSCULAR | Status: DC

## 2017-12-21 NOTE — Progress Notes (Signed)
12/21/2017 1:49 PM   DOB: 27-Mar-1971 / MRN: [id number]  SUBJECTIVE:  Deborah Cordova is a 46 y.o. female presenting for follow up of asthma exacerbation. She is taking maximal doses of pulmicort daily and does not miss doses.  She takes albuterol rescue with a spacer q4-6 hours as needed.  She got some relief with her last roung of steroids but feels that her asthma is now rebounding and she is worried that she will eventually need hospitalization if her symptoms continue.   She is allergic to septra [sulfamethoxazole-trimethoprim]; aspirin; cantaloupe (diagnostic); kiwi extract; nsaids; other; pineapple; and pyridium [phenazopyridine hcl].   She  has a past medical history of Asthma, COPD (chronic obstructive pulmonary disease) ([place]), Fibroid, Genital warts, Hypertension, PID (pelvic inflammatory disease), and Renal disorder.    She  reports that  has never smoked. she has never used smokeless tobacco. She reports that she drinks about 0.6 - 1.8 oz of alcohol per week. She reports that she does not use drugs. She  reports that she currently engages in sexual activity and has had partners who are Female. She reports using the following method of birth control/protection: Surgical. The patient  has a past surgical history that includes Dilation and curettage of uterus; Colposcopy; laser genital warts; and Laparoscopic total hysterectomy.  Her family history includes Asthma in her brother, mother, and sister; Breast cancer in her sister; COPD in her father; Cancer in her maternal grandfather, paternal grandfather, and paternal grandmother; Depression in her mother; Diabetes in her brother and mother; Emphysema in her father; Hepatitis C in her father and mother; Hyperlipidemia in her brother; Hypertension in her mother; Stroke in her maternal grandmother.  ROS  The problem list and medications were reviewed and updated by myself where necessary and exist elsewhere in the encounter.   OBJECTIVE:  BP  (!) 145/73   Pulse 85   Temp 98.8 F (37.1 C) (Oral)   Resp 16   Ht 5\' 8"  (1.727 m)   Wt 211 lb (95.7 kg)   LMP 03/29/2016 (Approximate)   SpO2 98%   BMI 32.08 kg/m   Physical Exam  Constitutional: She is oriented to person, place, and time.  Cardiovascular: Normal rate, regular rhythm and normal heart sounds.  Pulmonary/Chest: Effort normal. No respiratory distress. She has wheezes (persistent through two nebs). She has no rales. She exhibits no tenderness.  Musculoskeletal: Normal range of motion.  Neurological: She is alert and oriented to person, place, and time.  Skin: Skin is warm and dry.    No results found for this or any previous visit (from the past 72 hour(s)).  Dg Chest 2 View  Result Date: 12/21/2017 CLINICAL DATA:  Patient with cough. EXAM: CHEST  2 VIEW COMPARISON:  Chest radiograph 03/16/2017 FINDINGS: Limited exam as the right and left costophrenic angles are excluded from view. Normal cardiac and mediastinal contours. No consolidative pulmonary opacities. No pleural effusion or pneumothorax. Regional skeleton is unremarkable. IMPRESSION: The right and left costophrenic angles are excluded from view, limiting evaluation. Within the above limitation, no acute cardiopulmonary process. Electronically Signed   By: Lovey Newcomer M.D.   On: 12/21/2017 12:11    ASSESSMENT AND PLAN:  Deborah Cordova was seen today for follow-up.  Diagnoses and all orders for this visit:  Cough -     albuterol (PROVENTIL) (2.5 MG/3ML) 0.083% nebulizer solution 2.5 mg -     ipratropium (ATROVENT) nebulizer solution 0.5 mg -     DG Chest 2 View  Severe persistent asthma with acute exacerbation: Her asthma is impressive. Will treat with a longer course of pred and with an IM injection today.  Starting nebulized pulmicort for now and she will hold that inhaler.  She has nebs at home and a rescue inhaler.  She needs to be placed back on her xolair as I worry that she will evetually be harmed by  excessive steroid use.  -     Discontinue: budesonide (PULMICORT) 0.5 MG/2ML nebulizer solution; Take 6 mLs (1.5 mg total) by nebulization every 6 (six) hours. -     predniSONE (DELTASONE) 20 MG tablet; Take 3 in the morning for 3 days, then 2 in the morning for 3 days, and then 1 in the morning for 3 days. -     doxycycline (VIBRAMYCIN) 100 MG capsule; Take 1 capsule (100 mg total) by mouth 2 (two) times daily for 10 days. -     fluconazole (DIFLUCAN) 150 MG tablet; Take 1 tablet (150 mg total) by mouth once for 1 dose. Repeat if needed -     budesonide (PULMICORT) 1 MG/2ML nebulizer solution; Take as directed. -     Discontinue: methylPREDNISolone sodium succinate (SOLU-MEDROL) 125 mg/2 mL injection 62.5 mg -     methylPREDNISolone sodium succinate (SOLU-MEDROL) 125 mg/2 mL injection 62.5 mg  Other orders -     Discontinue: budesonide (PULMICORT) 0.5 MG/2ML nebulizer solution; Take 6 mLs (1.5 mg total) by nebulization every 6 (six) hours.    The patient is advised to call or return to clinic if she does not see an improvement in symptoms, or to seek the care of the closest emergency department if she worsens with the above plan.   Philis Fendt, MHS, PA-C [facility] 12/21/2017 1:49 PM

## 2017-12-21 NOTE — Patient Instructions (Addendum)
  Take the pulmicort twice daily via nebs. Hold your pulmicort inhaler while taking the nebulizer med.  Do not mix albuterol or ipratropium with the pumicort.   Start the steroid and doxy today.    IF you received an x-ray today, you will receive an invoice from [facility]. Please contact [facility] at [phone] with questions or concerns regarding your invoice.   IF you received labwork today, you will receive an invoice from [facility]. Please contact [facility] at [phone] with questions or concerns regarding your invoice.   Our billing staff will not be able to assist you with questions regarding bills from these companies.  You will be contacted with the lab results as soon as they are available. The fastest way to get your results is to activate your My Chart account. Instructions are located on the last page of this paperwork. If you have not heard from [place] regarding the results in 2 weeks, please contact this office.

## 2017-12-23 LAB — SEDIMENTATION RATE

## 2017-12-23 LAB — C-REACTIVE PROTEIN: CRP: 11.8 mg/L — AB (ref 0.0–4.9)

## 2017-12-23 NOTE — Addendum Note (Signed)
Addended by: Gari Crown D on: 12/23/2017 11:00 AM   Modules accepted: Orders

## 2017-12-24 LAB — RHEUMATOID FACTOR: RHEUMATOID FACTOR: 11 [IU]/mL (ref 0.0–13.9)

## 2017-12-24 LAB — ANA: ANA Titer 1: NEGATIVE

## 2017-12-24 LAB — CYCLIC CITRUL PEPTIDE ANTIBODY, IGG/IGA: Cyclic Citrullin Peptide Ab: 9 units (ref 0–19)

## 2017-12-24 LAB — SPECIMEN STATUS REPORT

## 2017-12-25 MED FILL — VENTOLIN HFA 90 MCG INHALER: 108 (90 BAS | 75 days supply | Qty: 54 | Fill #0 | Status: TO

## 2017-12-25 MED FILL — predniSONE 50 MG TABS: 50 | 3 days supply | Qty: 3 | Fill #0

## 2017-12-25 MED FILL — AMLODIPINE BESYLATE 5 MG TA: 5 | 90 days supply | Qty: 90 | Fill #0

## 2017-12-25 MED FILL — TERBINAFINE HCL 250 MG TAB: 250 | 30 days supply | Qty: 30 | Fill #0

## 2017-12-25 NOTE — Telephone Encounter (Signed)
error 

## 2017-12-28 ENCOUNTER — Encounter: Payer: Self-pay | Admitting: Emergency Medicine

## 2017-12-28 ENCOUNTER — Other Ambulatory Visit: Payer: Self-pay

## 2017-12-28 ENCOUNTER — Ambulatory Visit (INDEPENDENT_AMBULATORY_CARE_PROVIDER_SITE_OTHER): Payer: 59 | Admitting: Emergency Medicine

## 2017-12-28 VITALS — BP 118/64 | HR 108 | Temp 98.0°F | Resp 16 | Wt 213.0 lb

## 2017-12-28 DIAGNOSIS — R05 Cough: Secondary | ICD-10-CM

## 2017-12-28 DIAGNOSIS — H6691 Otitis media, unspecified, right ear: Secondary | ICD-10-CM | POA: Diagnosis not present

## 2017-12-28 DIAGNOSIS — Z8709 Personal history of other diseases of the respiratory system: Secondary | ICD-10-CM

## 2017-12-28 DIAGNOSIS — R059 Cough, unspecified: Secondary | ICD-10-CM

## 2017-12-28 DIAGNOSIS — M791 Myalgia, unspecified site: Secondary | ICD-10-CM | POA: Insufficient documentation

## 2017-12-28 DIAGNOSIS — J111 Influenza due to unidentified influenza virus with other respiratory manifestations: Secondary | ICD-10-CM | POA: Insufficient documentation

## 2017-12-28 LAB — POC INFLUENZA A&B (BINAX/QUICKVUE)
INFLUENZA B, POC: NEGATIVE
Influenza A, POC: NEGATIVE

## 2017-12-28 MED ORDER — HYDROCODONE-ACETAMINOPHEN 5-325 MG PO TABS: 1.0000 | ORAL_TABLET | Freq: Four times a day (QID) | ORAL | 0 refills | Status: DC | PRN

## 2017-12-28 MED ORDER — PREDNISONE 20 MG PO TABS: 40.0000 mg | ORAL_TABLET | Freq: Every day | ORAL | 0 refills | Status: AC

## 2017-12-28 MED ORDER — AZITHROMYCIN 250 MG PO TABS: ORAL_TABLET | ORAL | 0 refills | Status: DC

## 2017-12-28 MED FILL — HYDROCODON-APAP 5-325: 5-325 | 3 days supply | Qty: 15 | Fill #0

## 2017-12-28 MED FILL — AZITHROMYCIN 250 MG TAB: 250 | 5 days supply | Qty: 6 | Fill #0

## 2017-12-28 MED FILL — predniSONE 20 MG TABS: 20 | 5 days supply | Qty: 10 | Fill #0

## 2017-12-28 NOTE — Patient Instructions (Addendum)
     IF you received an x-ray today, you will receive an invoice from [facility]. Please contact [facility] at [phone] with questions or concerns regarding your invoice.   IF you received labwork today, you will receive an invoice from [facility]. Please contact [facility] at [phone] with questions or concerns regarding your invoice.   Our billing staff will not be able to assist you with questions regarding bills from these companies.  You will be contacted with the lab results as soon as they are available. The fastest way to get your results is to activate your My Chart account. Instructions are located on the last page of this paperwork. If you have not heard from us regarding the results in 2 weeks, please contact this office.      Influenza, Adult Influenza ("the flu") is an infection in the lungs, nose, and throat (respiratory tract). It is caused by a virus. The flu causes many common cold symptoms, as well as a high fever and body aches. It can make you feel very sick. The flu spreads easily from person to person (is contagious). Getting a flu shot (influenza vaccination) every year is the best way to prevent the flu. Follow these instructions at home:  Take over-the-counter and prescription medicines only as told by your doctor.  Use a cool mist humidifier to add moisture (humidity) to the air in your home. This can make it easier to breathe.  Rest as needed.  Drink enough fluid to keep your pee (urine) clear or pale yellow.  Cover your mouth and nose when you cough or sneeze.  Wash your hands with soap and water often, especially after you cough or sneeze. If you cannot use soap and water, use hand sanitizer.  Stay home from work or school as told by your doctor. Unless you are visiting your doctor, try to avoid leaving home until your fever has been gone for 24 hours without the use of medicine.  Keep all follow-up visits as told by your doctor.  This is important. How is this prevented?  Getting a yearly (annual) flu shot is the best way to avoid getting the flu. You may get the flu shot in late summer, fall, or winter. Ask your doctor when you should get your flu shot.  Wash your hands often or use hand sanitizer often.  Avoid contact with people who are sick during cold and flu season.  Eat healthy foods.  Drink plenty of fluids.  Get enough sleep.  Exercise regularly. Contact a doctor if:  You get new symptoms.  You have:  Chest pain.  Watery poop (diarrhea).  A fever.  Your cough gets worse.  You start to have more mucus.  You feel sick to your stomach (nauseous).  You throw up (vomit). Get help right away if:  You start to be short of breath or have trouble breathing.  Your skin or nails turn a bluish color.  You have very bad pain or stiffness in your neck.  You get a sudden headache.  You get sudden pain in your face or ear.  You cannot stop throwing up. This information is not intended to replace advice given to you by your health care provider. Make sure you discuss any questions you have with your health care provider. Document Released: 09/23/2008 Document Revised: 05/22/2016 Document Reviewed: 10/09/2015 Elsevier Interactive Patient Education  2017 Elsevier Inc.  

## 2017-12-28 NOTE — Progress Notes (Signed)
c 

## 2017-12-28 NOTE — Progress Notes (Signed)
Deborah Cordova 46 y.o.   Chief Complaint  Patient presents with  . Cough    x 2 days productive with yellow, green mucus  . Sore Throat    HISTORY OF PRESENT ILLNESS: This is a 46 y.o. female with h/o asthma c/o flu-like symptoms x 3 days.   Influenza  This is a new problem. The current episode started in the past 7 days. The problem has been waxing and waning. Associated symptoms include chills, congestion, coughing, fatigue, a fever, headaches, myalgias, a sore throat and weakness. Pertinent negatives include no abdominal pain, anorexia, arthralgias, chest pain, nausea, rash, swollen glands, urinary symptoms or vomiting. She has tried acetaminophen for the symptoms.     Prior to Admission medications   Medication Sig Start Date End Date Taking? Authorizing Provider  acyclovir (ZOVIRAX) 400 MG tablet Take 400 mg by mouth as needed (For fever blisters.).   Yes [provider]  albuterol (PROAIR HFA) 108 (90 Base) MCG/ACT inhaler Inhale 2 puffs into the lungs every 4 (four) hours as needed for wheezing or shortness of breath. 10/12/17  Yes Stallings, Zoe A, MD  amLODipine (NORVASC) 5 MG tablet Take 1 tablet (5 mg total) by mouth daily. 12/14/17  Yes Stallings, Zoe A, MD  budesonide (PULMICORT) 1 MG/2ML nebulizer solution Take as directed. 12/21/17  Yes Tereasa Coop, PA-C  Butalbital-APAP-Caffeine 50-300-40 MG CAPS Take by mouth as needed.   Yes [provider]  cetirizine (ZYRTEC ALLERGY) 10 MG tablet Take 10 mg by mouth daily.   Yes [provider]  gabapentin (NEURONTIN) 300 MG capsule Take 1 capsule (300 mg total) by mouth at bedtime. 09/29/17  Yes Stallings, Zoe A, MD  montelukast (SINGULAIR) 10 MG tablet Take 1 tablet (10 mg total) by mouth at bedtime. 10/12/17  Yes Stallings, Zoe A, MD  ranitidine (ZANTAC) 150 MG tablet Take 150 mg by mouth 2 (two) times daily.   Yes [provider]  acetaminophen (TYLENOL) 500 MG tablet Take 1,000 mg by mouth  every 6 (six) hours as needed for headache.    [provider]  budesonide-formoterol (SYMBICORT) 160-4.5 MCG/ACT inhaler Take 2 puffs first thing in am and then another 2 puffs about 12 hours later. Patient not taking: Reported on 12/28/2017 12/14/17   Tereasa Coop, PA-C  doxycycline (VIBRAMYCIN) 100 MG capsule Take 1 capsule (100 mg total) by mouth 2 (two) times daily for 10 days. Patient not taking: Reported on 12/28/2017 12/21/17 12/31/17  Tereasa Coop, PA-C  gentamicin (GARAMYCIN) 0.3 % ophthalmic solution Place 1 drop into the left eye every 4 (four) hours. Patient not taking: Reported on 12/28/2017 12/17/17   Shawnee Knapp, MD  predniSONE (DELTASONE) 20 MG tablet Take 3 in the morning for 3 days, then 2 in the morning for 3 days, and then 1 in the morning for 3 days. Patient not taking: Reported on 12/28/2017 12/21/17 12/30/17  Tereasa Coop, PA-C  terbinafine (LAMISIL) 250 MG tablet Take 1 tablet (250 mg total) by mouth daily. Patient not taking: Reported on 12/28/2017 11/27/17   Evelina Bucy, DPM    Allergies  Allergen Reactions  . Septra [Sulfamethoxazole-Trimethoprim] Hives, Swelling and Rash  . Aspirin Other (See Comments)    Asthma flare up - wheezing  . Cantaloupe (Diagnostic) Itching and Other (See Comments)    Makes tongue itch  . Kiwi Extract Itching and Other (See Comments)    Makes tongue itch  . Nsaids Other (See Comments)  Asthma flare up - wheezing   . Other Other (See Comments)    Pecans and walnuts---induce asthma attack   . Pineapple Other (See Comments)    Tongue bleeds  . Pyridium [Phenazopyridine Hcl] Itching    Itching, swelling and burning    Patient Active Problem List   Diagnosis Date Noted  . Hypertension   . Severe persistent asthma without complication     Past Medical History:  Diagnosis Date  . Asthma   . COPD (chronic obstructive pulmonary disease) ([place])   . Fibroid   . Genital warts   . Hypertension   . PID  (pelvic inflammatory disease)   . Renal disorder     Past Surgical History:  Procedure Laterality Date  . COLPOSCOPY    . DILATION AND CURETTAGE OF UTERUS    . LAPAROSCOPIC TOTAL HYSTERECTOMY    . laser genital warts      Social History   Socioeconomic History  . Marital status: Married    Spouse name: Not on file  . Number of children: Not on file  . Years of education: Not on file  . Highest education level: Not on file  Social Needs  . Financial resource strain: Not on file  . Food insecurity - worry: Not on file  . Food insecurity - inability: Not on file  . Transportation needs - medical: Not on file  . Transportation needs - non-medical: Not on file  Occupational History  . Not on file  Tobacco Use  . Smoking status: Never Smoker  . Smokeless tobacco: Never Used  Substance and Sexual Activity  . Alcohol use: Yes    Alcohol/week: 0.6 - 1.8 oz    Types: 1 - 3 Glasses of wine per week  . Drug use: No  . Sexual activity: Yes    Partners: Male    Birth control/protection: Surgical    Comment: hysterectomy  Other Topics Concern  . Not on file  Social History Narrative  . Not on file    Family History  Problem Relation Age of Onset  . Depression Mother   . Diabetes Mother   . Hepatitis C Mother   . Hypertension Mother   . Asthma Mother   . COPD Father   . Emphysema Father        smoked  . Hepatitis C Father   . Asthma Sister   . Breast cancer Sister   . Asthma Brother   . Diabetes Brother   . Hyperlipidemia Brother   . Stroke Maternal Grandmother   . Cancer Maternal Grandfather   . Cancer Paternal Grandmother   . Cancer Paternal Grandfather      Review of Systems  Constitutional: Positive for chills, fatigue, fever and malaise/fatigue.  HENT: Positive for congestion and sore throat.   Eyes: Negative for discharge and redness.  Respiratory: Positive for cough, sputum production and wheezing. Negative for shortness of breath.   Cardiovascular:  Negative for chest pain.  Gastrointestinal: Negative for abdominal pain, anorexia, diarrhea, nausea and vomiting.  Musculoskeletal: Positive for myalgias. Negative for arthralgias.  Skin: Negative for rash.  Neurological: Positive for weakness and headaches. Negative for dizziness.  All other systems reviewed and are negative.  Vitals:   12/28/17 0743  BP: 118/64  Pulse: (!) 108  Resp: 16  Temp: 98 F (36.7 C)  SpO2: 99%      Physical Exam  Constitutional: She is oriented to person, place, and time. She appears well-developed and well-nourished. She appears  ill.  HENT:  Head: Normocephalic and atraumatic.  Right Ear: External ear and ear canal normal. Tympanic membrane is erythematous.  Left Ear: Tympanic membrane, external ear and ear canal normal.  Nose: Nose normal.  Mouth/Throat: Uvula is midline. No uvula swelling. Posterior oropharyngeal erythema present. No oropharyngeal exudate or tonsillar abscesses.  Eyes: Conjunctivae and EOM are normal. Pupils are equal, round, and reactive to light.  Neck: Normal range of motion. Neck supple. No JVD present.  Cardiovascular: Normal rate, regular rhythm and normal heart sounds.  Pulmonary/Chest: Effort normal and breath sounds normal.  Musculoskeletal: Normal range of motion.  Lymphadenopathy:    She has no cervical adenopathy.  Neurological: She is alert and oriented to person, place, and time. No sensory deficit. She exhibits normal muscle tone.  Skin: Skin is warm and dry. Capillary refill takes less than 2 seconds. No rash noted.  Psychiatric: She has a normal mood and affect. Her behavior is normal.  Vitals reviewed.    ASSESSMENT & PLAN: Deborah Cordova was seen today for cough and sore throat.  Diagnoses and all orders for this visit:  Influenza with respiratory manifestation  Cough -     POC Influenza A&B(BINAX/QUICKVUE)  History of asthma -     predniSONE (DELTASONE) 20 MG tablet; Take 2 tablets (40 mg total) by mouth  daily with breakfast for 5 days.  Generalized muscle ache -     HYDROcodone-acetaminophen (NORCO) 5-325 MG tablet; Take 1 tablet by mouth every 6 (six) hours as needed for moderate pain.  Right otitis media, unspecified otitis media type -     azithromycin (ZITHROMAX) 250 MG tablet; Sig as indicated    Patient Instructions       IF you received an x-ray today, you will receive an invoice from [facility]. Please contact [facility] at [phone] with questions or concerns regarding your invoice.   IF you received labwork today, you will receive an invoice from [facility]. Please contact [facility] at [phone] with questions or concerns regarding your invoice.   Our billing staff will not be able to assist you with questions regarding bills from these companies.  You will be contacted with the lab results as soon as they are available. The fastest way to get your results is to activate your My Chart account. Instructions are located on the last page of this paperwork. If you have not heard from [place] regarding the results in 2 weeks, please contact this office.     Influenza, Adult Influenza ("the flu") is an infection in the lungs, nose, and throat (respiratory tract). It is caused by a virus. The flu causes many common cold symptoms, as well as a high fever and body aches. It can make you feel very sick. The flu spreads easily from person to person (is contagious). Getting a flu shot (influenza vaccination) every year is the best way to prevent the flu. Follow these instructions at home:  Take over-the-counter and prescription medicines only as told by your doctor.  Use a cool mist humidifier to add moisture (humidity) to the air in your home. This can make it easier to breathe.  Rest as needed.  Drink enough fluid to keep your pee (urine) clear or pale yellow.  Cover your mouth and nose when you cough or sneeze.  Wash your hands with soap and water often,  especially after you cough or sneeze. If you cannot use soap and water, use hand sanitizer.  Stay home from work or school as told  by your doctor. Unless you are visiting your doctor, try to avoid leaving home until your fever has been gone for 24 hours without the use of medicine.  Keep all follow-up visits as told by your doctor. This is important. How is this prevented?  Getting a yearly (annual) flu shot is the best way to avoid getting the flu. You may get the flu shot in late summer, fall, or winter. Ask your doctor when you should get your flu shot.  Wash your hands often or use hand sanitizer often.  Avoid contact with people who are sick during cold and flu season.  Eat healthy foods.  Drink plenty of fluids.  Get enough sleep.  Exercise regularly. Contact a doctor if:  You get new symptoms.  You have: ? Chest pain. ? Watery poop (diarrhea). ? A fever.  Your cough gets worse.  You start to have more mucus.  You feel sick to your stomach (nauseous).  You throw up (vomit). Get help right away if:  You start to be short of breath or have trouble breathing.  Your skin or nails turn a bluish color.  You have very bad pain or stiffness in your neck.  You get a sudden headache.  You get sudden pain in your face or ear.  You cannot stop throwing up. This information is not intended to replace advice given to you by your health care provider. Make sure you discuss any questions you have with your health care provider. Document Released: 09/23/2008 Document Revised: 05/22/2016 Document Reviewed: 10/09/2015 Elsevier Interactive Patient Education  2017 Elsevier Inc.      Agustina Caroli, MD [facility]

## 2017-12-31 ENCOUNTER — Other Ambulatory Visit: Payer: Self-pay | Admitting: Family Medicine

## 2017-12-31 MED ORDER — ONDANSETRON HCL 4 MG PO TABS: 4.0000 mg | ORAL_TABLET | Freq: Three times a day (TID) | ORAL | 0 refills | Status: DC | PRN

## 2018-01-01 ENCOUNTER — Other Ambulatory Visit: Payer: Self-pay | Admitting: Emergency Medicine

## 2018-01-01 MED ORDER — MECLIZINE HCL 25 MG PO TABS: 25.0000 mg | ORAL_TABLET | Freq: Three times a day (TID) | ORAL | 0 refills | Status: DC | PRN

## 2018-01-07 ENCOUNTER — Other Ambulatory Visit: Payer: Self-pay | Admitting: Physician Assistant

## 2018-01-07 ENCOUNTER — Other Ambulatory Visit: Payer: Self-pay | Admitting: *Deleted

## 2018-01-07 ENCOUNTER — Ambulatory Visit: Payer: 59 | Admitting: Physician Assistant

## 2018-01-07 DIAGNOSIS — J4550 Severe persistent asthma, uncomplicated: Secondary | ICD-10-CM

## 2018-01-07 DIAGNOSIS — J45998 Other asthma: Secondary | ICD-10-CM | POA: Diagnosis not present

## 2018-01-10 LAB — ALLERGENS W/TOTAL IGE AREA 2
Alternaria Alternata IgE: <0.1 kU/L
Aspergillus Fumigatus IgE: <0.1 kU/L
Cat Dander IgE: 9.68 kU/L — AB
Cedar, Mountain IgE: 0.71 kU/L — AB
Cladosporium Herbarum IgE: <0.1 kU/L
Cockroach, German IgE: 0.98 kU/L — AB
D Farinae IgE: 12.8 kU/L — AB
D Pteronyssinus IgE: 14.1 kU/L — AB
E005-IGE DOG DANDER: 14.4 kU/L — AB
Elm, American IgE: 0.15 kU/L — AB
G002-IGE BERMUDA GRASS: 1.35 kU/L — AB
G010-IGE JOHNSON GRASS: 1.48 kU/L — AB
IGE (IMMUNOGLOBULIN E), SERUM: 481 [IU]/mL — AB (ref 0–100)
Maple/Box Elder IgE: 0.87 kU/L — AB
Oak, White IgE: 9.23 kU/L — AB
Pecan, Hickory IgE: 5.55 kU/L — AB
Penicillium Chrysogen IgE: <0.1 kU/L
Pigweed, Rough IgE: <0.1 kU/L
Ragweed, Short IgE: 0.25 kU/L — AB
Sheep Sorrel IgE Qn: 0.18 kU/L — AB
T003-IGE COMMON SILVER BIRCH: 11.4 kU/L — AB
TIMOTHY IGE: 2.75 kU/L — AB
WHITE MULBERRY IGE: 0.13 kU/L — AB

## 2018-01-11 ENCOUNTER — Telehealth: Payer: Self-pay | Admitting: *Deleted

## 2018-01-11 MED ORDER — OMALIZUMAB 150 MG ~~LOC~~ SOLR: 300.0000 mg | SUBCUTANEOUS | 11 refills | Status: DC

## 2018-01-11 MED ORDER — EPINEPHRINE 0.3 MG/0.3ML IJ SOAJ: 0.3000 mg | Freq: Once | INTRAMUSCULAR | 1 refills | Status: AC

## 2018-01-11 NOTE — Telephone Encounter (Signed)
Patient is a Equities trader.  I was calling to see if she had new insurance.  I left her a message to call me back.

## 2018-01-11 NOTE — Progress Notes (Signed)
Deborah Cordova is a 47 y.o. female with a history of IgE mediated asthma.  Her medications currently include maximum doses of hand held symbicort, montelukast, daily rescue inhaler therapy, at home nebs which are scheduled, and at least 3 rounds of PO prednisone in the last 6-8 weeks for acute exacerbations. She has been hospitalized in the past for asthma.   Despite this treatment schedule she continues to have flares of her asthma. She has used Xolair in the past without incident.  I have spoken to her primary who agrees that she remains uncontrolled and the benefits of Xolair outweigh the risk.    Wt Readings from Last 3 Encounters:  12/28/17 213 lb (96.6 kg)  12/21/17 211 lb (95.7 kg)  12/19/17 213 lb (96.6 kg)   Temp Readings from Last 3 Encounters:  12/28/17 98 F (36.7 C) (Oral)  12/21/17 98.8 F (37.1 C) (Oral)  12/19/17 98 F (36.7 C) (Oral)   BP Readings from Last 3 Encounters:  12/28/17 118/64  12/21/17 (!) 145/73  12/19/17 136/81   Pulse Readings from Last 3 Encounters:  12/28/17 (!) 108  12/21/17 85  12/19/17 87   Her updated total IgE is 481.  Xolair package insert corresponds to 300 mg every 28 days.  That order is in.  The IM injection will be administered in the 102 or 104 clinic by an RN.   Philis Fendt, MS, PA-C 8:23 AM, 01/11/2018

## 2018-01-11 NOTE — Progress Notes (Signed)
Good morning Dr. Nolon Rod.  Please see addended note I have ordered  Xolair at 300 mg every month.  Hopefully this gives her some control.

## 2018-01-12 NOTE — Telephone Encounter (Signed)
Patient informed Deborah Cordova that she still has UMR.

## 2018-01-13 ENCOUNTER — Encounter: Payer: Self-pay | Admitting: Podiatry

## 2018-01-13 ENCOUNTER — Telehealth: Payer: Self-pay | Admitting: Podiatry

## 2018-01-13 DIAGNOSIS — M2012 Hallux valgus (acquired), left foot: Secondary | ICD-10-CM | POA: Diagnosis not present

## 2018-01-13 DIAGNOSIS — M25572 Pain in left ankle and joints of left foot: Secondary | ICD-10-CM | POA: Diagnosis not present

## 2018-01-13 DIAGNOSIS — M21612 Bunion of left foot: Secondary | ICD-10-CM | POA: Diagnosis not present

## 2018-01-13 DIAGNOSIS — I10 Essential (primary) hypertension: Secondary | ICD-10-CM | POA: Diagnosis not present

## 2018-01-13 MED ORDER — ONDANSETRON HCL 4 MG PO TABS: 4.0000 mg | ORAL_TABLET | Freq: Three times a day (TID) | ORAL | 0 refills | Status: DC | PRN

## 2018-01-13 MED ORDER — CEPHALEXIN 500 MG PO CAPS: 500.0000 mg | ORAL_CAPSULE | Freq: Two times a day (BID) | ORAL | 0 refills | Status: DC

## 2018-01-13 MED ORDER — OXYCODONE-ACETAMINOPHEN 10-325 MG PO TABS: 1.0000 | ORAL_TABLET | ORAL | 0 refills | Status: DC | PRN

## 2018-01-13 NOTE — Telephone Encounter (Signed)
Patient underwent surgical correction of L foot bunion today. Rxes sent electronically to pharmacy.

## 2018-01-14 ENCOUNTER — Telehealth: Payer: Self-pay | Admitting: Podiatry

## 2018-01-14 DIAGNOSIS — Z9889 Other specified postprocedural states: Secondary | ICD-10-CM

## 2018-01-14 MED ORDER — HYDROMORPHONE HCL 2 MG PO TABS: 2.0000 mg | ORAL_TABLET | ORAL | 0 refills | Status: DC | PRN

## 2018-01-14 NOTE — Telephone Encounter (Signed)
I spoke to Dr. March Rummage about 30 minutes ago and he sent in a new prescription to [facility] but it flagged their system because he gave me a different pain medication yesterday. I told the pt I had gotten the message from the pharmacy and sent it to Dr. March Rummage and that he still has patients he is seeing and will have to do it in between. Pt stated she is currently at the pharmacy waiting.

## 2018-01-14 NOTE — Telephone Encounter (Signed)
This is Deborah Cordova. I had foot surgery yesterday by Dr. March Rummage. I wanted to know if I can get some crutches please. I know I'm in a boot but I can't put weight on my foot with the pain I'm in. The pain he gave me ain't doing nothing, it's not touching this pain. All its doing is making me sleepy but it ain't easing up the pain. I'm here by myself of a day because my husband has to work so its hard for me to get back and forth to the bathroom and I can't put any weight on this foot so I need crutches please. I also need something to help with this dog on pain. If you need to speak to me my call back number is [phone]. Again this is Deborah Cordova.

## 2018-01-14 NOTE — Telephone Encounter (Signed)
Called pharmacy and gave additional clinical info. They okayed the new Rx.

## 2018-01-14 NOTE — Telephone Encounter (Addendum)
Dr. March Rummage states he will contact pt concerning the pain medication and to order crutches from [facility]. Faxed orders for crutches to [facility].

## 2018-01-14 NOTE — Addendum Note (Signed)
Addended by: Harriett Sine D on: 01/14/2018 03:00 PM   Modules accepted: Orders

## 2018-01-14 NOTE — Progress Notes (Signed)
DOS 01.16.2019 [place]

## 2018-01-14 NOTE — Telephone Encounter (Signed)
Thank you :)

## 2018-01-14 NOTE — Telephone Encounter (Signed)
Called patient for post-op check. Patient having a lot of pain despite Percocet. Rxed Dilaudid for pain control.  Discussed continued icing, elevating.   [facility]erral [facility] to [facility] for [facility].

## 2018-01-14 NOTE — Telephone Encounter (Signed)
This is Product/process development scientist calling in regards to the electronic prescription we just got on the pt. It is for hydromorphone 2 mg and we just filled a prescription for percocet 10-325 yesterday by the same doctor. The system is flagging [place] so it is requiring me to get more information as to why she is also needing the hydromorphone. Please call us back at [phone] so I can make a note of this into the computer. Thank you. Bye bye.

## 2018-01-14 NOTE — Addendum Note (Signed)
Addended by: Hardie Pulley on: 01/14/2018 03:00 PM   Modules accepted: Orders

## 2018-01-15 ENCOUNTER — Telehealth: Payer: Self-pay | Admitting: Podiatry

## 2018-01-15 DIAGNOSIS — J4531 Mild persistent asthma with (acute) exacerbation: Secondary | ICD-10-CM | POA: Diagnosis not present

## 2018-01-15 DIAGNOSIS — M21619 Bunion of unspecified foot: Secondary | ICD-10-CM | POA: Diagnosis not present

## 2018-01-15 DIAGNOSIS — B351 Tinea unguium: Secondary | ICD-10-CM | POA: Diagnosis not present

## 2018-01-15 DIAGNOSIS — M79609 Pain in unspecified limb: Secondary | ICD-10-CM | POA: Diagnosis not present

## 2018-01-15 NOTE — Telephone Encounter (Signed)
I had surgery on Wednesday and I was wondering if I could take this bandage off and soak my foot in warm water or epsom salt water? The numbness has completely worn off and I'm trying not to take the pain pills as much because the dilaudid gives me headaches so I've only taken 2 since he gave them to me. This pain keeps shooting up my leg, I know I had surgery on my foot and its going to hurt but I was wondering do I leave the gauze on until my follow up appointment on Wednesday or can I take it off? Also, do I need to go ahead and start the antibiotic because I didn't know when y'all wanted me to take that. Can somebody please call and let me know. My number is [phone]. Thanks.

## 2018-01-15 NOTE — Telephone Encounter (Signed)
Left two messages pt's phone voice mail cut off. I informed pt that she should remain in the surgical dressing until the 1st POV, be in the shoe at all times even to rest and if she could tolerate the over the counter Ibuprofen as package directs, rest and if problems contact the on call line. I told her I would message Dr. March Rummage concerning the pain medication and to begin the antibiotic.

## 2018-01-18 ENCOUNTER — Telehealth: Payer: Self-pay | Admitting: Podiatry

## 2018-01-18 ENCOUNTER — Encounter: Payer: Self-pay | Admitting: *Deleted

## 2018-01-18 NOTE — Telephone Encounter (Signed)
I had surgery last Wednesday by Dr. March Rummage. Can you give me a work note for Wednesday, Thursday, and Friday because I wasn't at work. I'm did come back today and then I come back on Wednesday and see you all so I will need a note for that day as well. You can fax it to [phone]. Thank you.

## 2018-01-18 NOTE — Telephone Encounter (Signed)
Faxed note to pt's requested fax [phone].

## 2018-01-19 ENCOUNTER — Telehealth: Payer: Self-pay | Admitting: Podiatry

## 2018-01-19 ENCOUNTER — Encounter: Payer: Self-pay | Admitting: *Deleted

## 2018-01-19 NOTE — Telephone Encounter (Signed)
Dr. March Rummage did my surgery on Wednesday on my foot. 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 As far as I know I'm not supposed to be putting any weight on this foot and it does it hurt to try and walk on it even with the boot on. So I wanted to know if I can get a note from Dr. March Rummage stating that I am not to have any weight bearing on the foot until after my follow up with him because they are wanting me to triage patients on crutches. If you need to speak to me you can call me back on my cell at [phone]. Again my name is Deborah Cordova. Thank you.

## 2018-01-19 NOTE — Telephone Encounter (Signed)
Pt states her foot is hurting and swollen and she can't walk and triage. I asked pt if Dr.Price had released her to work and she states she went back to work on her own. I told her I could write for her to be non-weight bearing but Dr. March Rummage may not want her at work at all. Non-weight bearing note faxed to [employer].

## 2018-01-20 ENCOUNTER — Ambulatory Visit (INDEPENDENT_AMBULATORY_CARE_PROVIDER_SITE_OTHER): Payer: 59

## 2018-01-20 ENCOUNTER — Ambulatory Visit (INDEPENDENT_AMBULATORY_CARE_PROVIDER_SITE_OTHER): Payer: 59 | Admitting: Podiatry

## 2018-01-20 ENCOUNTER — Encounter: Payer: Self-pay | Admitting: Podiatry

## 2018-01-20 VITALS — BP 113/69 | HR 85 | Temp 98.6°F

## 2018-01-20 DIAGNOSIS — M21619 Bunion of unspecified foot: Secondary | ICD-10-CM | POA: Diagnosis not present

## 2018-01-20 DIAGNOSIS — Z9889 Other specified postprocedural states: Secondary | ICD-10-CM

## 2018-01-20 MED ORDER — FLUCONAZOLE 150 MG PO TABS: 150.0000 mg | ORAL_TABLET | Freq: Once | ORAL | 0 refills | Status: AC

## 2018-01-20 NOTE — Progress Notes (Signed)
  Subjective:  Patient ID: Deborah Cordova, female    DOB: [birth year],  MRN: [id number]  Chief Complaint  Patient presents with  . Routine Post Op    pov#1 dos 01.16.2019 Double Osteotomy Lt    DOS: 01/13/18 Procedure: Margretta Sidle bunionectomy  47 y.o. female returns for post-op check. Denies N/V/F/Ch. Pain is controlled with current medications.  States that the Dilaudid hurt her head too much so she switched back to her Percocet.  Reports pain and swelling in the left foot states that her supervisor has made her work despite discussing prior to surgery that she would need to remain light level of activity with extended rest and elevation of the left lower extremity  Dates she did not take the antibiotic that was ordered her husband did not tell her she needs to take it.   Objective:   General AA&O x3. Normal mood and affect.  Vascular Foot warm and well perfused.  Edema noted left foot  Neurologic Gross sensation intact.  Dermatologic Skin healing well without signs of infection. Skin edges well coapted without signs of infection.  Orthopedic: Tenderness to palpation noted about the surgical site.    Assessment & Plan:  Patient was evaluated and treated and all questions answered.  S/p left [place] bunion correction -Progressing as expected post-operatively. -Sutures: Left intact. -Medications refilled: None -Patient to take abx ppx. Rx Fluconazole per patient request due to frequent yeast infection with abx therapy. -Foot redressed.  No Follow-up on file.

## 2018-01-22 ENCOUNTER — Other Ambulatory Visit: Payer: Self-pay | Admitting: Physician Assistant

## 2018-01-25 ENCOUNTER — Telehealth: Payer: Self-pay

## 2018-01-25 NOTE — Telephone Encounter (Signed)
PA started on 01/20/2018.  Patient was advised to wait 24-72 hours for outcome.  I called to the PA department today to follow up.  There were a series of questions that needed answering.  I did this according to documentation in patient's chart, however, I did not see the documented pulmonology visit because it was prior to last year.  When speaking to patient she told me she had seen Dr. Melvyn Novas at pulmonology prior to last year.  I am supposed to receive a call back from [employer] regarding patient, and at that time I will let them know she did see a pulmonologist.

## 2018-01-27 ENCOUNTER — Encounter: Payer: Self-pay | Admitting: Podiatry

## 2018-01-27 ENCOUNTER — Ambulatory Visit (INDEPENDENT_AMBULATORY_CARE_PROVIDER_SITE_OTHER): Payer: 59 | Admitting: Podiatry

## 2018-01-27 DIAGNOSIS — Z9889 Other specified postprocedural states: Secondary | ICD-10-CM

## 2018-01-27 NOTE — Progress Notes (Signed)
  Subjective:  Patient ID: Deborah Cordova, female    DOB: 12-14-1971,  MRN: [id number]  Chief Complaint  Patient presents with  . Routine Post Op    01/13/18 i had surgery on and i am ready to get rid of these crutches    DOS: 01/13/18 Procedure: Margretta Sidle bunionectomy  47 y.o. female returns for post-op check. Denies N/V/F/Ch.  Has not been taking her pain medication for several days.  Has been avoiding weightbearing on the left lower extremity and has been using crutches.  Would like to get rid of using the crutches.  States she has taken her antibiotic as required  Objective:   General AA&O x3. Normal mood and affect.  Vascular Foot warm and well perfused.  Edema noted at the distal hallux.  Neurologic Gross sensation intact.  Dermatologic Skin healing well without signs of infection.  Skin edges healed well proximally.  Well coapted with intact suture distally.  Orthopedic: Tenderness to palpation noted about the surgical site.    Assessment & Plan:  Patient was evaluated and treated and all questions answered.  S/p left Austin bunionectomy -Progressing as expected post-operatively. -Sutures: Partially removed today due to excessive swelling distally 3 sutures left intact to be removed by RN on Monday. -Medications refilled: none -Foot redressed. -Patient educated on proper weightbearing with her cam boot and that she does not need to be using her crutches.  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  Follow-up in 2 weeks for x-rays

## 2018-01-28 ENCOUNTER — Encounter: Payer: Self-pay | Admitting: Podiatry

## 2018-01-28 ENCOUNTER — Telehealth: Payer: Self-pay

## 2018-01-28 NOTE — Telephone Encounter (Signed)
Received denial letter on Xolair from patient's insurance. Patient aware.

## 2018-02-01 ENCOUNTER — Ambulatory Visit (INDEPENDENT_AMBULATORY_CARE_PROVIDER_SITE_OTHER): Payer: 59

## 2018-02-01 DIAGNOSIS — M21619 Bunion of unspecified foot: Secondary | ICD-10-CM | POA: Diagnosis not present

## 2018-02-01 DIAGNOSIS — Z9889 Other specified postprocedural states: Secondary | ICD-10-CM

## 2018-02-01 NOTE — Progress Notes (Signed)
Patient presents s/p Lt Austin bunionectomy on 1.16.19, she states that she is having a hard time walking in her boot, and putting pressure on her foot.   Noted well healing surgical site, with some post operative swelling. No drainage noted, sutures removed and I advised her to wait 24 hours before showering. Wound edges remained coapted, with no gapping.   Encouraged passive ROM exercises and expressed importance of mobility in big toe joint. Remain in boot at all times and I encouraged her to try to walk heel to toe in boot.  She is to follow up in 2 weeks to see Dr March Rummage

## 2018-02-03 ENCOUNTER — Telehealth: Payer: Self-pay | Admitting: Podiatry

## 2018-02-03 ENCOUNTER — Telehealth: Payer: Self-pay | Admitting: Physician Assistant

## 2018-02-03 ENCOUNTER — Ambulatory Visit (INDEPENDENT_AMBULATORY_CARE_PROVIDER_SITE_OTHER): Payer: 59 | Admitting: Podiatry

## 2018-02-03 ENCOUNTER — Encounter: Payer: Self-pay | Admitting: Podiatry

## 2018-02-03 ENCOUNTER — Other Ambulatory Visit: Payer: Self-pay | Admitting: Family Medicine

## 2018-02-03 DIAGNOSIS — Z9889 Other specified postprocedural states: Secondary | ICD-10-CM

## 2018-02-03 MED ORDER — CEPHALEXIN 500 MG PO CAPS: 500.0000 mg | ORAL_CAPSULE | Freq: Two times a day (BID) | ORAL | 0 refills | Status: DC

## 2018-02-03 MED ORDER — RANITIDINE HCL 150 MG PO TABS: 150.0000 mg | ORAL_TABLET | Freq: Two times a day (BID) | ORAL | 11 refills | Status: AC

## 2018-02-03 MED FILL — MONTELUKAST SOD 10 MG TAB: 10 | 90 days supply | Qty: 90 | Fill #0

## 2018-02-03 MED FILL — raNITIdine HCL 150 MG TABS: 150 | 30 days supply | Qty: 60 | Fill #0

## 2018-02-03 MED FILL — CEPHALEXIN 500 MG CAPSULE: 500 | 3 days supply | Qty: 6 | Fill #0

## 2018-02-03 NOTE — Telephone Encounter (Signed)
I told pt that I felt she would benefit from an appt with the nurse today and transferred pt to the schedulers.

## 2018-02-03 NOTE — Telephone Encounter (Signed)
Good morning. I had foot surgery on 16 January and just came in and had my sutures removed on Monday 04 February. Last night I pushed a pus like substance out of my toe area where the sutures were. Can the nurse call me back and let me know what I need to do. I can be reached at [phone]. Thank you.

## 2018-02-10 ENCOUNTER — Encounter: Payer: Self-pay | Admitting: Podiatry

## 2018-02-10 ENCOUNTER — Ambulatory Visit (INDEPENDENT_AMBULATORY_CARE_PROVIDER_SITE_OTHER): Payer: 59

## 2018-02-10 ENCOUNTER — Ambulatory Visit (INDEPENDENT_AMBULATORY_CARE_PROVIDER_SITE_OTHER): Payer: 59 | Admitting: Podiatry

## 2018-02-10 DIAGNOSIS — Z9889 Other specified postprocedural states: Secondary | ICD-10-CM

## 2018-02-10 DIAGNOSIS — M2012 Hallux valgus (acquired), left foot: Secondary | ICD-10-CM | POA: Diagnosis not present

## 2018-02-11 ENCOUNTER — Telehealth: Payer: Self-pay | Admitting: *Deleted

## 2018-02-11 DIAGNOSIS — J4540 Moderate persistent asthma, uncomplicated: Secondary | ICD-10-CM

## 2018-02-11 NOTE — Telephone Encounter (Signed)
Pt request refill of the terbinafine. 

## 2018-02-11 NOTE — Progress Notes (Signed)
  Subjective:  Patient ID: Deborah Cordova, female    DOB: 1971-03-06,  MRN: [id number]  Chief Complaint  Patient presents with  . Routine Post Op    pov#3 dos 01.16.2019 Double Osteotomy Lt    DOS: 01/13/17 Procedure: Left Austin bunionectomy  47 y.o. female returns for post-op check.  Pain is improving.  Doing well.  Has been finally able to walk without using assistive devices.  Objective:   General AA&O x3. Normal mood and affect.  Vascular Foot warm and well perfused.  Neurologic Gross sensation intact.  Dermatologic Skin well-healed with intact Steri-Strips.  No erythema.  Orthopedic: Tenderness to palpation noted about the surgical site.    Assessment & Plan:  Patient was evaluated and treated and all questions answered.  S/p left Austin bunionectomy -Progressing as expected post-operatively. -X-rays taken today.  Consistent with postop state.  Osteotomy healing well -Continue weightbearing as tolerated in cam boot.  In 2 weeks will transition to surgical shoe.  Return in about 2 weeks (around 02/24/2018) for Post-op.

## 2018-02-16 NOTE — Telephone Encounter (Signed)
Opened in error. Philis Fendt, MS, PA-C 2:25 PM, 02/16/2018

## 2018-02-17 NOTE — Progress Notes (Signed)
  Subjective:  Patient ID: Deborah Cordova, female    DOB: May 09, 1971,  MRN: [id number]  No chief complaint on file.   DOS: 01/13/18 Procedure: Margretta Sidle Bunionectomy  47 y.o. female returns for post-op check. Denies N/V/F/Ch. Pain is controlled with current medications. States that she noticed some drainage and burning sensation.  Afebrile today  Objective:  There were no vitals filed for this visit.  General AA&O x3. Normal mood and affect.  Vascular Foot warm and well perfused.  Neurologic Gross sensation intact.  Dermatologic Skin healing well. Small area with slight erythema and serous drainage. No purulence. No ascending cellulitis.  Orthopedic: Tenderness to palpation noted about the surgical site.    Assessment & Plan:  Patient was evaluated and treated and all questions answered.  S/p L Austin bunionectomy -Progressing as expected post-operatively. -Small -Medications refilled: Keflex for abx Ppx due to redness and drainage. -Foot redressed. -Continue WBAT in CAM Boot.  No Follow-up on file.

## 2018-02-20 ENCOUNTER — Other Ambulatory Visit: Payer: Self-pay | Admitting: Urgent Care

## 2018-02-20 MED ORDER — CLOTRIMAZOLE-BETAMETHASONE 1-0.05 % EX CREA: 1.0000 "application " | TOPICAL_CREAM | Freq: Two times a day (BID) | CUTANEOUS | 0 refills | Status: DC

## 2018-02-22 ENCOUNTER — Telehealth: Payer: Self-pay | Admitting: *Deleted

## 2018-02-22 MED FILL — FLUCONAZOLE 150 MG TABLET: 150 | 2 days supply | Qty: 2 | Fill #0

## 2018-02-22 NOTE — Telephone Encounter (Signed)
Pt states she needs a refill of the Terbinafine.

## 2018-02-22 NOTE — Telephone Encounter (Signed)
I spoke with pt and she states she started the terbinafine after she had the surgery. I told pt she needed to discuss the therapy with Dr. March Rummage prior to continuing and she is scheduled for POV 02/24/2018. I noted in appt log pt needed to discuss the terbinafine therapy.

## 2018-02-24 ENCOUNTER — Encounter: Payer: Self-pay | Admitting: Podiatry

## 2018-02-24 ENCOUNTER — Ambulatory Visit (INDEPENDENT_AMBULATORY_CARE_PROVIDER_SITE_OTHER): Payer: 59 | Admitting: Podiatry

## 2018-02-24 DIAGNOSIS — J3081 Allergic rhinitis due to animal (cat) (dog) hair and dander: Secondary | ICD-10-CM | POA: Diagnosis not present

## 2018-02-24 DIAGNOSIS — J301 Allergic rhinitis due to pollen: Secondary | ICD-10-CM | POA: Diagnosis not present

## 2018-02-24 DIAGNOSIS — B351 Tinea unguium: Secondary | ICD-10-CM

## 2018-02-24 DIAGNOSIS — Z79899 Other long term (current) drug therapy: Secondary | ICD-10-CM

## 2018-02-24 DIAGNOSIS — J4520 Mild intermittent asthma, uncomplicated: Secondary | ICD-10-CM | POA: Diagnosis not present

## 2018-02-24 DIAGNOSIS — J3089 Other allergic rhinitis: Secondary | ICD-10-CM | POA: Diagnosis not present

## 2018-02-24 MED ORDER — TERBINAFINE HCL 250 MG PO TABS: 250.0000 mg | ORAL_TABLET | Freq: Every day | ORAL | 0 refills | Status: DC

## 2018-02-24 MED FILL — TERBINAFINE HCL 250 MG TAB: 250 | 30 days supply | Qty: 30 | Fill #0

## 2018-02-25 ENCOUNTER — Ambulatory Visit: Payer: 59

## 2018-02-25 ENCOUNTER — Telehealth: Payer: Self-pay | Admitting: Internal Medicine

## 2018-02-25 DIAGNOSIS — Z79899 Other long term (current) drug therapy: Secondary | ICD-10-CM

## 2018-02-25 NOTE — Progress Notes (Signed)
  Subjective:  Patient ID: Deborah Cordova, female    DOB: 1971/10/18,  MRN: [id number]  Chief Complaint  Patient presents with  . Routine Post Op    i am doing good over all    DOS: 01/13/17 Procedure: Left Austin bunionectomy  47 y.o. female returns for post-op check. States that she did not get a call for therapy.  States that the toe feels stiff.  Pain is much improved ambulating in cam boot without issue and without assistive devices  Objective:   General AA&O x3. Normal mood and affect.  Vascular Foot warm and well perfused.  Neurologic Gross sensation intact.  Dermatologic Skin well-healed with thin scar  Orthopedic: Tenderness to palpation noted about the surgical site.  Pain on range of motion of the left first MPJ.    Assessment & Plan:  Patient was evaluated and treated and all questions answered.  S/p left Austin bunionectomy -Progressing as expected post-operatively. -X-rays taken today.  Consistent with postop state.  Osteotomy healing well -Referral to PT  -Transition to surgical shoe today.  Shoe dispensed.  Weightbearing as tolerated in surgical shoe.  We will plan to transition to regular shoe in 2 weeks.  Onychomycosis  -LFTs ordered -Lamisil refilled  Return in about 2 weeks (around 03/10/2018) for Post-op.  With new x-rays to left foot

## 2018-02-25 NOTE — Telephone Encounter (Signed)
Pt requesting referral for persistent uncontrolled asthma. Would like to talk to Pulmonologist.

## 2018-02-25 NOTE — Telephone Encounter (Signed)
Called and spoke with pt stating to her if she wanted a second opinion by Dr. Chase Cordova based on what Dr. Melvyn Cordova had said then we need to get her to come in for a consult appt with Dr. Chase Cordova for the second opinion.  I stated to pt that MW had wanted to refer her to Dr. Bruna Cordova group for an allergy eval due to MW not wanting pt to begin on xolair to see what alternatives could be done.  Pt stated she went and saw Dr. Donneta Cordova who stated pt would be fine to begin xolair but needed approval from a pulmonologist but due to Dr. Melvyn Cordova not wanting pt to begin on it was why she was wanting a second opinion.  After conversation with pt, she stated there was a doctor in [place], [place] who was fine giving the approval for pt to begin Xolair and she would go there for further care.  Nothing further needed at this current time.

## 2018-02-26 ENCOUNTER — Telehealth: Payer: Self-pay | Admitting: *Deleted

## 2018-02-26 DIAGNOSIS — Z9889 Other specified postprocedural states: Secondary | ICD-10-CM

## 2018-02-26 LAB — HEPATIC FUNCTION PANEL
ALBUMIN: 4.3 g/dL (ref 3.5–5.5)
ALT: 12 IU/L (ref 0–32)
AST: 11 IU/L (ref 0–40)
Alkaline Phosphatase: 60 IU/L (ref 39–117)
BILIRUBIN TOTAL: 0.3 mg/dL (ref 0.0–1.2)
BILIRUBIN, DIRECT: 0.11 mg/dL (ref 0.00–0.40)
TOTAL PROTEIN: 7.2 g/dL (ref 6.0–8.5)

## 2018-02-26 NOTE — Telephone Encounter (Signed)
Dr. March Rummage ordered PT for S/P left bunionectomy. Hand delivered PT orders to [facility] - In-office.

## 2018-03-11 ENCOUNTER — Encounter: Payer: Self-pay | Admitting: Podiatry

## 2018-03-11 ENCOUNTER — Ambulatory Visit (INDEPENDENT_AMBULATORY_CARE_PROVIDER_SITE_OTHER): Payer: 59 | Admitting: Family Medicine

## 2018-03-11 ENCOUNTER — Ambulatory Visit (INDEPENDENT_AMBULATORY_CARE_PROVIDER_SITE_OTHER): Payer: 59 | Admitting: Podiatry

## 2018-03-11 ENCOUNTER — Ambulatory Visit: Payer: 59 | Admitting: Family Medicine

## 2018-03-11 ENCOUNTER — Ambulatory Visit (INDEPENDENT_AMBULATORY_CARE_PROVIDER_SITE_OTHER): Payer: 59

## 2018-03-11 DIAGNOSIS — M79675 Pain in left toe(s): Secondary | ICD-10-CM | POA: Diagnosis not present

## 2018-03-11 DIAGNOSIS — M25475 Effusion, left foot: Secondary | ICD-10-CM | POA: Diagnosis not present

## 2018-03-11 DIAGNOSIS — M21619 Bunion of unspecified foot: Secondary | ICD-10-CM

## 2018-03-11 DIAGNOSIS — M25675 Stiffness of left foot, not elsewhere classified: Secondary | ICD-10-CM | POA: Diagnosis not present

## 2018-03-11 DIAGNOSIS — R109 Unspecified abdominal pain: Secondary | ICD-10-CM | POA: Diagnosis not present

## 2018-03-11 DIAGNOSIS — R269 Unspecified abnormalities of gait and mobility: Secondary | ICD-10-CM | POA: Diagnosis not present

## 2018-03-13 LAB — H. PYLORI BREATH TEST: H pylori Breath Test: NEGATIVE

## 2018-03-15 DIAGNOSIS — M79675 Pain in left toe(s): Secondary | ICD-10-CM | POA: Diagnosis not present

## 2018-03-15 DIAGNOSIS — R269 Unspecified abnormalities of gait and mobility: Secondary | ICD-10-CM | POA: Diagnosis not present

## 2018-03-15 DIAGNOSIS — M25475 Effusion, left foot: Secondary | ICD-10-CM | POA: Diagnosis not present

## 2018-03-15 DIAGNOSIS — M25675 Stiffness of left foot, not elsewhere classified: Secondary | ICD-10-CM | POA: Diagnosis not present

## 2018-03-18 MED FILL — AMLODIPINE BESYLATE 5 MG TA: 5 | 90 days supply | Qty: 90 | Fill #1

## 2018-03-18 MED FILL — VENTOLIN HFA 90 MCG INHALER: 108 (90 BAS | 75 days supply | Qty: 54 | Fill #1 | Status: TO

## 2018-03-18 MED FILL — SYMBICORT 160-4.5 MCG INH: 160-4.5 | 90 days supply | Qty: 31 | Fill #0

## 2018-03-18 MED FILL — raNITIdine HCL 150 MG TABS: 150 | 30 days supply | Qty: 60 | Fill #1

## 2018-03-22 DIAGNOSIS — M25475 Effusion, left foot: Secondary | ICD-10-CM | POA: Diagnosis not present

## 2018-03-22 DIAGNOSIS — M25675 Stiffness of left foot, not elsewhere classified: Secondary | ICD-10-CM | POA: Diagnosis not present

## 2018-03-22 DIAGNOSIS — M79675 Pain in left toe(s): Secondary | ICD-10-CM | POA: Diagnosis not present

## 2018-03-22 DIAGNOSIS — R269 Unspecified abnormalities of gait and mobility: Secondary | ICD-10-CM | POA: Diagnosis not present

## 2018-03-24 ENCOUNTER — Encounter: Payer: Self-pay | Admitting: Family Medicine

## 2018-03-24 ENCOUNTER — Other Ambulatory Visit: Payer: Self-pay | Admitting: Podiatry

## 2018-03-24 ENCOUNTER — Ambulatory Visit (INDEPENDENT_AMBULATORY_CARE_PROVIDER_SITE_OTHER): Payer: 59 | Admitting: Family Medicine

## 2018-03-24 VITALS — BP 153/88 | HR 86 | Temp 98.6°F | Ht 68.0 in | Wt 208.0 lb

## 2018-03-24 DIAGNOSIS — N898 Other specified noninflammatory disorders of vagina: Secondary | ICD-10-CM | POA: Diagnosis not present

## 2018-03-24 LAB — POCT URINALYSIS DIP (MANUAL ENTRY)
Bilirubin, UA: NEGATIVE
Blood, UA: NEGATIVE
Glucose, UA: NEGATIVE mg/dL
Ketones, POC UA: NEGATIVE mg/dL
NITRITE UA: NEGATIVE
PROTEIN UA: NEGATIVE mg/dL
SPEC GRAV UA: 1.025 (ref 1.010–1.025)
UROBILINOGEN UA: 0.2 U/dL
pH, UA: 5.5 (ref 5.0–8.0)

## 2018-03-24 LAB — POCT WET + KOH PREP
Trich by wet prep: ABSENT
YEAST BY KOH: ABSENT
YEAST BY WET PREP: ABSENT

## 2018-03-24 MED ORDER — CLOTRIMAZOLE-BETAMETHASONE 1-0.05 % EX CREA: 1.0000 "application " | TOPICAL_CREAM | Freq: Two times a day (BID) | CUTANEOUS | 0 refills | Status: DC

## 2018-03-24 MED ORDER — FLUCONAZOLE 150 MG PO TABS: 150.0000 mg | ORAL_TABLET | Freq: Once | ORAL | 0 refills | Status: AC

## 2018-03-24 NOTE — Patient Instructions (Addendum)
   IF you received an x-ray today, you will receive an invoice from [facility]. Please contact [facility] at [phone] with questions or concerns regarding your invoice.   IF you received labwork today, you will receive an invoice from [facility]. Please contact [facility] at [phone] with questions or concerns regarding your invoice.   Our billing staff will not be able to assist you with questions regarding bills from these companies.  You will be contacted with the lab results as soon as they are available. The fastest way to get your results is to activate your My Chart account. Instructions are located on the last page of this paperwork. If you have not heard from us regarding the results in 2 weeks, please contact this office.     Vaginitis Vaginitis is a condition in which the vaginal tissue swells and becomes red (inflamed). This condition is most often caused by a change in the normal balance of bacteria and yeast that live in the vagina. This change causes an overgrowth of certain bacteria or yeast, which causes the inflammation. There are different types of vaginitis, but the most common types are:  Bacterial vaginosis.  Yeast infection (candidiasis).  Trichomoniasis vaginitis. This is a sexually transmitted disease (STD).  Viral vaginitis.  Atrophic vaginitis.  Allergic vaginitis.  What are the causes? The cause of this condition depends on the type of vaginitis. It can be caused by:  Bacteria (bacterial vaginosis).  Yeast, which is a fungus (yeast infection).  A parasite (trichomoniasis vaginitis).  A virus (viral vaginitis).  Low hormone levels (atrophic vaginitis). Low hormone levels can occur during pregnancy, breastfeeding, or after menopause.  Irritants, such as bubble baths, scented tampons, and feminine sprays (allergic vaginitis).  Other factors can change the normal balance of the yeast and bacteria that live in the vagina.  These include:  Antibiotic medicines.  Poor hygiene.  Diaphragms, vaginal sponges, spermicides, birth control pills, and intrauterine devices (IUD).  Sex.  Infection.  Uncontrolled diabetes.  A weakened defense (immune) system.  What increases the risk? This condition is more likely to develop in women who:  Smoke.  Use vaginal douches, scented tampons, or scented sanitary pads.  Wear tight-fitting pants.  Wear thong underwear.  Use oral birth control pills or an IUD.  Have sex without a condom.  Have multiple sex partners.  Have an STD.  Frequently use the spermicide nonoxynol-9.  Eat lots of foods high in sugar.  Have uncontrolled diabetes.  Have low estrogen levels.  Have a weakened immune system from an immune disorder or medical treatment.  Are pregnant or breastfeeding.  What are the signs or symptoms? Symptoms vary depending on the cause of the vaginitis. Common symptoms include:  Abnormal vaginal discharge. ? The discharge is white, gray, or yellow with bacterial vaginosis. ? The discharge is thick, white, and cheesy with a yeast infection. ? The discharge is frothy and yellow or greenish with trichomoniasis.  A bad vaginal smell. The smell is fishy with bacterial vaginosis.  Vaginal itching, pain, or swelling.  Sex that is painful.  Pain or burning when urinating.  Sometimes there are no symptoms. How is this diagnosed? This condition is diagnosed based on your symptoms and medical history. A physical exam, including a pelvic exam, will also be done. You may also have other tests, including:  Tests to determine the pH level (acidity or alkalinity) of your vagina.  A whiff test, to assess the odor that results when a sample   of your vaginal discharge is mixed with a potassium hydroxide solution.  Tests of vaginal fluid. A sample will be examined under a microscope.  How is this treated? Treatment varies depending on the type of  vaginitis you have. Your treatment may include:  Antibiotic creams or pills to treat bacterial vaginosis and trichomoniasis.  Antifungal medicines, such as vaginal creams or suppositories, to treat a yeast infection.  Medicine to ease discomfort if you have viral vaginitis. Your sexual partner should also be treated.  Estrogen delivered in a cream, pill, suppository, or vaginal ring to treat atrophic vaginitis. If vaginal dryness occurs, lubricants and moisturizing creams may help. You may need to avoid scented soaps, sprays, or douches.  Stopping use of a product that is causing allergic vaginitis. Then using a vaginal cream to treat the symptoms.  Follow these instructions at home: Lifestyle  Keep your genital area clean and dry. Avoid soap, and only rinse the area with water.  Do not douche or use tampons until your health care provider says it is okay to do so. Use sanitary pads, if needed.  Do not have sex until your health care provider approves. When you can return to sex, practice safe sex and use condoms.  Wipe from front to back. This avoids the spread of bacteria from the rectum to the vagina. General instructions  Take over-the-counter and prescription medicines only as told by your health care provider.  If you were prescribed an antibiotic medicine, take or use it as told by your health care provider. Do not stop taking or using the antibiotic even if you start to feel better.  Keep all follow-up visits as told by your health care provider. This is important. How is this prevented?  Use mild, non-scented products. Do not use things that can irritate the vagina, such as fabric softeners. Avoid the following products if they are scented: ? Feminine sprays. ? Detergents. ? Tampons. ? Feminine hygiene products. ? Soaps or bubble baths.  Let air reach your genital area. ? Wear cotton underwear to reduce moisture buildup. ? Avoid wearing underwear while you  sleep. ? Avoid wearing tight pants and underwear or nylons without a cotton panel. ? Avoid wearing thong underwear.  Take off any wet clothing, such as bathing suits, as soon as possible.  Practice safe sex and use condoms. Contact a health care provider if:  You have abdominal pain.  You have a fever.  You have symptoms that last for more than 2-3 days. Get help right away if:  You have a fever and your symptoms suddenly get worse. Summary  Vaginitis is a condition in which the vaginal tissue becomes inflamed.This condition is most often caused by a change in the normal balance of bacteria and yeast that live in the vagina.  Treatment varies depending on the type of vaginitis you have.  Do not douche, use tampons , or have sex until your health care provider approves. When you can return to sex, practice safe sex and use condoms. This information is not intended to replace advice given to you by your health care provider. Make sure you discuss any questions you have with your health care provider. Document Released: 10/12/2007 Document Revised: 01/20/2017 Document Reviewed: 01/20/2017 Elsevier Interactive Patient Education  2018 [employer]vier Inc.  

## 2018-03-24 NOTE — Progress Notes (Signed)
Chief Complaint  Patient presents with  . Vaginal Itching    vaginal irritation and burning     HPI   Pt reports that she started having stinging with urination starting 24 hours ago then today she had burning with urination with urinary frequency She states that she took an epsom salt soak with lavender She did not change her soap She states that her last intercourse was Saturday.  She reports that her symptoms started yesterday   Past Medical History:  Diagnosis Date  . Asthma   . COPD (chronic obstructive pulmonary disease) ([place])   . Fibroid   . Genital warts   . Hypertension   . PID (pelvic inflammatory disease)   . Renal disorder     Current Outpatient Medications  Medication Sig Dispense Refill  . acetaminophen (TYLENOL) 500 MG tablet Take 1,000 mg by mouth every 6 (six) hours as needed for headache.    Marland Kitchen acyclovir (ZOVIRAX) 400 MG tablet Take 400 mg by mouth as needed (For fever blisters.).    Marland Kitchen albuterol (PROAIR HFA) 108 (90 Base) MCG/ACT inhaler Inhale 2 puffs into the lungs every 4 (four) hours as needed for wheezing or shortness of breath. 3 Inhaler 0  . amLODipine (NORVASC) 5 MG tablet Take 1 tablet (5 mg total) by mouth daily. 90 tablet 3  . azithromycin (ZITHROMAX) 250 MG tablet Sig as indicated 6 tablet 0  . budesonide (PULMICORT) 1 MG/2ML nebulizer solution Take as directed. 120 mL 0  . budesonide-formoterol (SYMBICORT) 160-4.5 MCG/ACT inhaler Take 2 puffs first thing in am and then another 2 puffs about 12 hours later. 3 Inhaler 1  . Butalbital-APAP-Caffeine 50-300-40 MG CAPS Take by mouth as needed.    . cephALEXin (KEFLEX) 500 MG capsule Take 1 capsule (500 mg total) by mouth 2 (two) times daily. 6 capsule 0  . cetirizine (ZYRTEC ALLERGY) 10 MG tablet Take 10 mg by mouth daily.    . clotrimazole-betamethasone (LOTRISONE) cream Apply 1 application topically 2 (two) times daily. 30 g 0  . gabapentin (NEURONTIN) 300 MG capsule Take 1 capsule (300 mg total) by  mouth at bedtime. 90 capsule 1  . gentamicin (GARAMYCIN) 0.3 % ophthalmic solution Place 1 drop into the left eye every 4 (four) hours. 5 mL 0  . HYDROcodone-acetaminophen (NORCO) 5-325 MG tablet Take 1 tablet by mouth every 6 (six) hours as needed for moderate pain. 15 tablet 0  . HYDROmorphone (DILAUDID) 2 MG tablet Take 1 tablet (2 mg total) by mouth every 4 (four) hours as needed for severe pain. 20 tablet 0  . meclizine (ANTIVERT) 25 MG tablet Take 1 tablet (25 mg total) by mouth 3 (three) times daily as needed for dizziness. 30 tablet 0  . montelukast (SINGULAIR) 10 MG tablet Take 1 tablet (10 mg total) by mouth at bedtime. 90 tablet 3  . omalizumab (XOLAIR) 150 MG injection Inject 300 mg into the skin every 28 (twenty-eight) days. Divide doses of more than 150 mg among more than one injection site. 2 each 11  . ondansetron (ZOFRAN) 4 MG tablet Take 1 tablet (4 mg total) by mouth every 8 (eight) hours as needed for nausea or vomiting. 20 tablet 0  . oxyCODONE-acetaminophen (PERCOCET) 10-325 MG tablet Take 1 tablet by mouth every 4 (four) hours as needed for pain. 20 tablet 0  . ranitidine (ZANTAC) 150 MG tablet Take 1 tablet (150 mg total) by mouth 2 (two) times daily. 60 tablet 11  . terbinafine (LAMISIL) 250 MG  tablet Take 1 tablet (250 mg total) by mouth daily. 30 tablet 0   Current Facility-Administered Medications  Medication Dose Route Frequency Provider Last Rate Last Dose  . albuterol (PROVENTIL) (2.5 MG/3ML) 0.083% nebulizer solution 2.5 mg  2.5 mg Nebulization Once Philis Fendt L, PA-C      . ipratropium (ATROVENT) nebulizer solution 0.5 mg  0.5 mg Nebulization Once Tereasa Coop, PA-C        Allergies:  Allergies  Allergen Reactions  . Septra [Sulfamethoxazole-Trimethoprim] Hives, Swelling and Rash  . Aspirin Other (See Comments)    Asthma flare up - wheezing  . Cantaloupe (Diagnostic) Itching and Other (See Comments)    Makes tongue itch  . Kiwi Extract Itching and  Other (See Comments)    Makes tongue itch  . Nsaids Other (See Comments)    Asthma flare up - wheezing   . Other Other (See Comments)    Pecans and walnuts---induce asthma attack   . Pineapple Other (See Comments)    Tongue bleeds  . Pyridium [Phenazopyridine Hcl] Itching    Itching, swelling and burning    Past Surgical History:  Procedure Laterality Date  . COLPOSCOPY    . DILATION AND CURETTAGE OF UTERUS    . LAPAROSCOPIC TOTAL HYSTERECTOMY    . laser genital warts      Social History   Socioeconomic History  . Marital status: Married    Spouse name: Not on file  . Number of children: Not on file  . Years of education: Not on file  . Highest education level: Not on file  Occupational History  . Not on file  Social Needs  . Financial resource strain: Not on file  . Food insecurity:    Worry: Not on file    Inability: Not on file  . Transportation needs:    Medical: Not on file    Non-medical: Not on file  Tobacco Use  . Smoking status: Never Smoker  . Smokeless tobacco: Never Used  Substance and Sexual Activity  . Alcohol use: Yes    Alcohol/week: 0.6 - 1.8 oz    Types: 1 - 3 Glasses of wine per week  . Drug use: No  . Sexual activity: Yes    Partners: Male    Birth control/protection: Surgical    Comment: hysterectomy  Lifestyle  . Physical activity:    Days per week: Not on file    Minutes per session: Not on file  . Stress: Not on file  Relationships  . Social connections:    Talks on phone: Not on file    Gets together: Not on file    Attends religious service: Not on file    Active member of club or organization: Not on file    Attends meetings of clubs or organizations: Not on file    Relationship status: Not on file  Other Topics Concern  . Not on file  Social History Narrative  . Not on file    Family History  Problem Relation Age of Onset  . Depression Mother   . Diabetes Mother   . Hepatitis C Mother   . Hypertension Mother   .  Asthma Mother   . COPD Father   . Emphysema Father        smoked  . Hepatitis C Father   . Asthma Sister   . Breast cancer Sister   . Asthma Brother   . Diabetes Brother   . Hyperlipidemia Brother   .  Stroke Maternal Grandmother   . Cancer Maternal Grandfather   . Cancer Paternal Grandmother   . Cancer Paternal Grandfather      ROS Review of Systems See HPI Constitution: No fevers or chills No malaise No diaphoresis Skin: No rash or itching Eyes: no blurry vision, no double vision GU: no dysuria or hematuria Neuro: no dizziness or headaches all others reviewed and negative   Objective: Vitals:   03/24/18 1109  Pulse: 86  SpO2: 98%  Weight: 208 lb (94.3 kg)  Height: 5\' 8"  (1.727 m)    Physical Exam Vaginal exam- Chaperone Present Labia normal bilaterally without skin lesions Urethral meatus normal appearing without erythema Vagina with white thick discharge No CMT, ovaries small and not palpable Uterus midline, nontender   Component     Latest Ref Rng & Units 03/24/2018 03/24/2018        11:19 AM 11:19 AM  Color, UA     yellow yellow   Clarity, UA     clear clear   Glucose     negative mg/dL negative   Bilirubin, UA     negative negative negative  Specific Gravity, UA     1.010 - 1.025 1.025   RBC, UA     negative negative   pH, UA     5.0 - 8.0 5.5   Protein, UA     negative mg/dL negative   Urobilinogen, UA     0.2 or 1.0 E.U./dL 0.2   Nitrite, UA     Negative Negative   Leukocytes, UA     Negative Trace (A)     Component     Latest Ref Rng & Units 03/24/2018  Yeast by KOH     Absent Absent  Yeast by wet prep     Absent Absent  WBC by wet prep     Few None (A)  Clue Cells Wet Prep HPF POC     None None  Trich by wet prep     Absent Absent  Bacteria Wet Prep HPF POC     Few Moderate (A)  Epithelial Cells By [employer] ([facility])     None, Few, Too numerous to count Moderate (A)  RBC,UR,HPF,POC     None RBC/hpf None    Assessment  and Plan Deborah Cordova was seen today for vaginal itching.  Diagnoses and all orders for this visit:  Vaginal irritation- will treat empirically for yeast  -     POCT Wet + KOH Prep -     POCT urinalysis dipstick     Kathi Dohn A Nolon Rod

## 2018-03-26 NOTE — Progress Notes (Signed)
Lab visit only.  Pt did not see provider at this visit.

## 2018-03-27 ENCOUNTER — Ambulatory Visit: Payer: 59 | Admitting: Family Medicine

## 2018-03-29 NOTE — Telephone Encounter (Signed)
Skyped Dr. March Rummage to check how he would like to evaluate pt for remaining lamisil.

## 2018-04-02 DIAGNOSIS — J449 Chronic obstructive pulmonary disease, unspecified: Secondary | ICD-10-CM | POA: Diagnosis not present

## 2018-04-02 DIAGNOSIS — J45909 Unspecified asthma, uncomplicated: Secondary | ICD-10-CM | POA: Diagnosis not present

## 2018-04-07 ENCOUNTER — Encounter: Payer: Self-pay | Admitting: Physician Assistant

## 2018-04-08 ENCOUNTER — Encounter: Payer: 59 | Admitting: Podiatry

## 2018-04-09 ENCOUNTER — Encounter: Payer: Self-pay | Admitting: Podiatry

## 2018-04-09 ENCOUNTER — Ambulatory Visit (INDEPENDENT_AMBULATORY_CARE_PROVIDER_SITE_OTHER): Payer: 59 | Admitting: Podiatry

## 2018-04-09 ENCOUNTER — Ambulatory Visit: Payer: 59

## 2018-04-09 ENCOUNTER — Ambulatory Visit (INDEPENDENT_AMBULATORY_CARE_PROVIDER_SITE_OTHER): Payer: 59

## 2018-04-09 DIAGNOSIS — Z87891 Personal history of nicotine dependence: Secondary | ICD-10-CM | POA: Diagnosis not present

## 2018-04-09 DIAGNOSIS — B351 Tinea unguium: Secondary | ICD-10-CM

## 2018-04-09 DIAGNOSIS — Z79899 Other long term (current) drug therapy: Secondary | ICD-10-CM | POA: Diagnosis not present

## 2018-04-09 DIAGNOSIS — Z9889 Other specified postprocedural states: Secondary | ICD-10-CM

## 2018-04-09 DIAGNOSIS — J449 Chronic obstructive pulmonary disease, unspecified: Secondary | ICD-10-CM | POA: Diagnosis not present

## 2018-04-09 MED ORDER — TERBINAFINE HCL 250 MG PO TABS: 250.0000 mg | ORAL_TABLET | Freq: Every day | ORAL | 0 refills | Status: DC

## 2018-04-09 MED FILL — TERBINAFINE HCL 250 MG TAB: 250 | 30 days supply | Qty: 30 | Fill #0

## 2018-04-10 LAB — HEPATIC FUNCTION PANEL
ALT: 18 IU/L (ref 0–32)
AST: 9 IU/L (ref 0–40)
Albumin: 4.4 g/dL (ref 3.5–5.5)
Alkaline Phosphatase: 59 IU/L (ref 39–117)
Bilirubin Total: <0.2 mg/dL (ref 0.0–1.2)
Bilirubin, Direct: 0.07 mg/dL (ref 0.00–0.40)
TOTAL PROTEIN: 7.2 g/dL (ref 6.0–8.5)

## 2018-04-11 NOTE — Progress Notes (Signed)
  Subjective:  Patient ID: Deborah Cordova, female    DOB: 1971/05/03,  MRN: [id number]  Chief Complaint  Patient presents with  . Routine Post Op    left bunionectomy - xrays today-  patient doing great, very happy with her results   DOS: 01/13/17 Procedure: Left [place] bunionectomy  47 y.o. female returns for post-op check.  Has been doing therapy and is doing very well.  States that the toe feels fine she is able to wear normal shoes without issue able to walk barefoot without issue.  Patient pleased with results of surgery has no complaints at this time.    Also noticing improvement in the appearance of her nails.  Would like a refill of Lamisil today  Objective:   General AA&O x3. Normal mood and affect.  Vascular Foot warm and well perfused.  Neurologic Gross sensation intact.  Dermatologic Skin well-healed with thin scar.  Nails with proximal clearing noted  Orthopedic:  Left hallux rectus without pain to palpation no limitation of range of motion.    Assessment & Plan:  Patient was evaluated and treated and all questions answered.  S/p left Austin bunionectomy -X-rays taken and show consolidation of the osteotomy site with intact hardware without any evidence of failure. -Patient pleased with results of surgery not having any pain or ambulating normal shoe gear without issue.  Onychomycosis, improving. -Repeat liver function studies ordered. Will d/c terbinafine if elevated during therapy. -eRx for oral terbinafine #30. Educated on risks and benefits of the medication.    Return in about 2 months (around 06/09/2018) for Post-op.

## 2018-04-12 ENCOUNTER — Ambulatory Visit (INDEPENDENT_AMBULATORY_CARE_PROVIDER_SITE_OTHER): Payer: 59 | Admitting: Family Medicine

## 2018-04-12 ENCOUNTER — Other Ambulatory Visit: Payer: Self-pay

## 2018-04-12 ENCOUNTER — Encounter: Payer: Self-pay | Admitting: Family Medicine

## 2018-04-12 VITALS — BP 120/84 | HR 96 | Temp 98.8°F | Ht 68.0 in | Wt 208.6 lb

## 2018-04-12 DIAGNOSIS — K0889 Other specified disorders of teeth and supporting structures: Secondary | ICD-10-CM | POA: Diagnosis not present

## 2018-04-12 MED FILL — ACETAMINOPHEN/COD #3 TABLET: 300-30 | 2 days supply | Qty: 20 | Fill #0

## 2018-04-12 MED FILL — AMOXICILLIN 500 MG CAPSULE: 500 | 7 days supply | Qty: 28 | Fill #0

## 2018-04-12 NOTE — Progress Notes (Signed)
Chief Complaint  Patient presents with  . Dental Pain    tooth pain since Thursday. Also has thrush    HPI   Pt reports that she had a temporary feeling prior to a root canal which has not been done yet The onset of the pain was 2 weeks ago when she bit down on a piece of chicken She reports that the pain in the gum and she also noted some white patches in her mouth and white on her tongue She states that her pain is 8/10 She reports that the pain is throbbing pain She reports that the tenderness is most severe  She is sensitive to hot and cold foods  Past Medical History:  Diagnosis Date  . Asthma   . COPD (chronic obstructive pulmonary disease) ([place])   . Fibroid   . Genital warts   . Hypertension   . PID (pelvic inflammatory disease)   . Renal disorder     Current Outpatient Medications  Medication Sig Dispense Refill  . acetaminophen (TYLENOL) 500 MG tablet Take 1,000 mg by mouth every 6 (six) hours as needed for headache.    Marland Kitchen acyclovir (ZOVIRAX) 400 MG tablet Take 400 mg by mouth as needed (For fever blisters.).    Marland Kitchen albuterol (PROAIR HFA) 108 (90 Base) MCG/ACT inhaler Inhale 2 puffs into the lungs every 4 (four) hours as needed for wheezing or shortness of breath. 3 Inhaler 0  . amLODipine (NORVASC) 5 MG tablet Take 1 tablet (5 mg total) by mouth daily. 90 tablet 3  . azithromycin (ZITHROMAX) 250 MG tablet Sig as indicated (Patient not taking: Reported on 04/12/2018) 6 tablet 0  . budesonide (PULMICORT) 1 MG/2ML nebulizer solution Take as directed. 120 mL 0  . budesonide-formoterol (SYMBICORT) 160-4.5 MCG/ACT inhaler Take 2 puffs first thing in am and then another 2 puffs about 12 hours later. 3 Inhaler 1  . Butalbital-APAP-Caffeine 50-300-40 MG CAPS Take by mouth as needed.    . cephALEXin (KEFLEX) 500 MG capsule Take 1 capsule (500 mg total) by mouth 2 (two) times daily. (Patient not taking: Reported on 04/12/2018) 6 capsule 0  . cetirizine (ZYRTEC ALLERGY) 10 MG tablet  Take 10 mg by mouth daily.    . clotrimazole-betamethasone (LOTRISONE) cream Apply 1 application topically 2 (two) times daily. Apply to affected area. 30 g 0  . gabapentin (NEURONTIN) 300 MG capsule Take 1 capsule (300 mg total) by mouth at bedtime. 90 capsule 1  . gentamicin (GARAMYCIN) 0.3 % ophthalmic solution Place 1 drop into the left eye every 4 (four) hours. 5 mL 0  . HYDROcodone-acetaminophen (NORCO) 5-325 MG tablet Take 1 tablet by mouth every 6 (six) hours as needed for moderate pain. 15 tablet 0  . HYDROmorphone (DILAUDID) 2 MG tablet Take 1 tablet (2 mg total) by mouth every 4 (four) hours as needed for severe pain. 20 tablet 0  . meclizine (ANTIVERT) 25 MG tablet Take 1 tablet (25 mg total) by mouth 3 (three) times daily as needed for dizziness. 30 tablet 0  . montelukast (SINGULAIR) 10 MG tablet Take 1 tablet (10 mg total) by mouth at bedtime. 90 tablet 3  . omalizumab (XOLAIR) 150 MG injection Inject 300 mg into the skin every 28 (twenty-eight) days. Divide doses of more than 150 mg among more than one injection site. 2 each 11  . ondansetron (ZOFRAN) 4 MG tablet Take 1 tablet (4 mg total) by mouth every 8 (eight) hours as needed for nausea or vomiting. [place]  tablet 0  . oxyCODONE-acetaminophen (PERCOCET) 10-325 MG tablet Take 1 tablet by mouth every 4 (four) hours as needed for pain. 20 tablet 0  . ranitidine (ZANTAC) 150 MG tablet Take 1 tablet (150 mg total) by mouth 2 (two) times daily. 60 tablet 11  . terbinafine (LAMISIL) 250 MG tablet Take 1 tablet (250 mg total) by mouth daily. 30 tablet 0   Current Facility-Administered Medications  Medication Dose Route Frequency Provider Last Rate Last Dose  . albuterol (PROVENTIL) (2.5 MG/3ML) 0.083% nebulizer solution 2.5 mg  2.5 mg Nebulization Once Philis Fendt L, PA-C      . ipratropium (ATROVENT) nebulizer solution 0.5 mg  0.5 mg Nebulization Once Tereasa Coop, PA-C        Allergies:  Allergies  Allergen Reactions  . Septra  [Sulfamethoxazole-Trimethoprim] Hives, Swelling and Rash  . Aspirin Other (See Comments)    Asthma flare up - wheezing  . Cantaloupe (Diagnostic) Itching and Other (See Comments)    Makes tongue itch  . Kiwi Extract Itching and Other (See Comments)    Makes tongue itch  . Nsaids Other (See Comments)    Asthma flare up - wheezing   . Other Other (See Comments)    Pecans and walnuts---induce asthma attack   . Pineapple Other (See Comments)    Tongue bleeds  . Pyridium [Phenazopyridine Hcl] Itching    Itching, swelling and burning    Past Surgical History:  Procedure Laterality Date  . COLPOSCOPY    . DILATION AND CURETTAGE OF UTERUS    . LAPAROSCOPIC TOTAL HYSTERECTOMY    . laser genital warts      Social History   Socioeconomic History  . Marital status: Married    Spouse name: Not on file  . Number of children: Not on file  . Years of education: Not on file  . Highest education level: Not on file  Occupational History  . Not on file  Social Needs  . Financial resource strain: Not on file  . Food insecurity:    Worry: Not on file    Inability: Not on file  . Transportation needs:    Medical: Not on file    Non-medical: Not on file  Tobacco Use  . Smoking status: Never Smoker  . Smokeless tobacco: Never Used  Substance and Sexual Activity  . Alcohol use: Yes    Alcohol/week: 0.6 - 1.8 oz    Types: 1 - 3 Glasses of wine per week  . Drug use: No  . Sexual activity: Yes    Partners: Male    Birth control/protection: Surgical    Comment: hysterectomy  Lifestyle  . Physical activity:    Days per week: Not on file    Minutes per session: Not on file  . Stress: Not on file  Relationships  . Social connections:    Talks on phone: Not on file    Gets together: Not on file    Attends religious service: Not on file    Active member of club or organization: Not on file    Attends meetings of clubs or organizations: Not on file    Relationship status: Not on file    Other Topics Concern  . Not on file  Social History Narrative  . Not on file    Family History  Problem Relation Age of Onset  . Depression Mother   . Diabetes Mother   . Hepatitis C Mother   . Hypertension Mother   .  Asthma Mother   . COPD Father   . Emphysema Father        smoked  . Hepatitis C Father   . Asthma Sister   . Breast cancer Sister   . Asthma Brother   . Diabetes Brother   . Hyperlipidemia Brother   . Stroke Maternal Grandmother   . Cancer Maternal Grandfather   . Cancer Paternal Grandmother   . Cancer Paternal Grandfather      ROS Review of Systems See HPI Constitution: No fevers or chills No malaise No diaphoresis Skin: No rash or itching Eyes: no blurry vision, no double vision GU: no dysuria or hematuria Neuro: no dizziness or headaches all others reviewed and negative   Objective: Vitals:   04/12/18 1147  BP: 120/84  Pulse: 96  Temp: 98.8 F (37.1 C)  TempSrc: Oral  SpO2: 98%  Weight: 208 lb 9.6 oz (94.6 kg)  Height: 5\' 8"  (1.727 m)    Physical Exam  Constitutional: She appears well-developed and well-nourished.  HENT:  Head: Normocephalic and atraumatic.  Mouth/Throat:      Assessment and Plan Deborah Cordova was seen today for dental pain.  Diagnoses and all orders for this visit:  Pain, dental  no visible abscess Concerning for nerve root pain due to tenderness at the tooth and the mandible As well as sensitivity to temperatures Follow up with Dentist   Deborah Cordova A Nolon Rod

## 2018-04-12 NOTE — Patient Instructions (Signed)
     IF you received an x-ray today, you will receive an invoice from [facility]. Please contact  Radiology at [phone] with questions or concerns regarding your invoice.   IF you received labwork today, you will receive an invoice from [facility]. Please contact [facility] at [phone] with questions or concerns regarding your invoice.   Our billing staff will not be able to assist you with questions regarding bills from these companies.  You will be contacted with the lab results as soon as they are available. The fastest way to get your results is to activate your My Chart account. Instructions are located on the last page of this paperwork. If you have not heard from us regarding the results in 2 weeks, please contact this office.     

## 2018-04-15 DIAGNOSIS — M25475 Effusion, left foot: Secondary | ICD-10-CM | POA: Diagnosis not present

## 2018-04-15 DIAGNOSIS — R269 Unspecified abnormalities of gait and mobility: Secondary | ICD-10-CM | POA: Diagnosis not present

## 2018-04-15 DIAGNOSIS — M79675 Pain in left toe(s): Secondary | ICD-10-CM | POA: Diagnosis not present

## 2018-04-15 DIAGNOSIS — M25675 Stiffness of left foot, not elsewhere classified: Secondary | ICD-10-CM | POA: Diagnosis not present

## 2018-04-20 ENCOUNTER — Telehealth: Payer: Self-pay | Admitting: Pharmacist

## 2018-04-20 NOTE — Telephone Encounter (Signed)
Called patient to schedule an appointment for the [facility]. I was unable to reach the patient so I left a HIPAA-compliant message requesting that the patient return my call.   

## 2018-04-21 ENCOUNTER — Telehealth: Payer: Self-pay | Admitting: Pharmacist

## 2018-04-21 DIAGNOSIS — M79675 Pain in left toe(s): Secondary | ICD-10-CM | POA: Diagnosis not present

## 2018-04-21 DIAGNOSIS — M25475 Effusion, left foot: Secondary | ICD-10-CM | POA: Diagnosis not present

## 2018-04-21 DIAGNOSIS — R269 Unspecified abnormalities of gait and mobility: Secondary | ICD-10-CM | POA: Diagnosis not present

## 2018-04-21 DIAGNOSIS — M25675 Stiffness of left foot, not elsewhere classified: Secondary | ICD-10-CM | POA: Diagnosis not present

## 2018-04-21 NOTE — Telephone Encounter (Signed)
Received VM from patient last night returning my call. Called her back on requested number today and was unable to reach her. Unable to leave a message. Will try again later today.

## 2018-04-21 NOTE — Telephone Encounter (Signed)
Attempted 2nd time to contact patient, unable to contact or leave a message. Will send secure email to patient.

## 2018-04-25 NOTE — Progress Notes (Signed)
  Subjective:  Patient ID: Deborah Cordova, female    DOB: 05-24-1971,  MRN: [id number]  Chief Complaint  Patient presents with  . Routine Post Op    pov # [id number] double osteoeotomy lt - left foot, has popping sensation at incision site at times, unable to completly bend toe"     DOS: 01/13/17 Procedure: Left Austin bunionectomy  47 y.o. female returns for post-op check. Reports popping sensation in the toe and stiffness.  Objective:   General AA&O x3. Normal mood and affect.  Vascular Foot warm and well perfused.  Neurologic Gross sensation intact.  Dermatologic Skin well-healed with thin scar  Orthopedic: Tenderness to palpation noted about the surgical site.  Pain on range of motion of the left first MPJ.    Assessment & Plan:  Patient was evaluated and treated and all questions answered.  S/p left Austin bunionectomy -Progressing as expected post-operatively. -X-rays taken today.  Consistent with postop state.  Osteotomy healing well -Transition to normal shoes.  Return in about 4 weeks (around 04/08/2018) for Post-op.  With new x-rays to left foot

## 2018-04-26 ENCOUNTER — Encounter: Payer: Self-pay | Admitting: Family Medicine

## 2018-04-26 ENCOUNTER — Other Ambulatory Visit: Payer: Self-pay

## 2018-04-26 ENCOUNTER — Ambulatory Visit (INDEPENDENT_AMBULATORY_CARE_PROVIDER_SITE_OTHER): Payer: 59 | Admitting: Pharmacist

## 2018-04-26 ENCOUNTER — Ambulatory Visit (INDEPENDENT_AMBULATORY_CARE_PROVIDER_SITE_OTHER): Payer: 59 | Admitting: Family Medicine

## 2018-04-26 VITALS — BP 126/85 | HR 103 | Temp 98.2°F | Resp 17 | Ht 68.0 in | Wt 209.8 lb

## 2018-04-26 DIAGNOSIS — J4550 Severe persistent asthma, uncomplicated: Secondary | ICD-10-CM

## 2018-04-26 DIAGNOSIS — J301 Allergic rhinitis due to pollen: Secondary | ICD-10-CM | POA: Diagnosis not present

## 2018-04-26 DIAGNOSIS — M25675 Stiffness of left foot, not elsewhere classified: Secondary | ICD-10-CM | POA: Diagnosis not present

## 2018-04-26 DIAGNOSIS — M79675 Pain in left toe(s): Secondary | ICD-10-CM | POA: Diagnosis not present

## 2018-04-26 DIAGNOSIS — R269 Unspecified abnormalities of gait and mobility: Secondary | ICD-10-CM | POA: Diagnosis not present

## 2018-04-26 DIAGNOSIS — N898 Other specified noninflammatory disorders of vagina: Secondary | ICD-10-CM

## 2018-04-26 DIAGNOSIS — Z79899 Other long term (current) drug therapy: Secondary | ICD-10-CM

## 2018-04-26 DIAGNOSIS — H109 Unspecified conjunctivitis: Secondary | ICD-10-CM

## 2018-04-26 DIAGNOSIS — H1013 Acute atopic conjunctivitis, bilateral: Secondary | ICD-10-CM | POA: Diagnosis not present

## 2018-04-26 DIAGNOSIS — M25475 Effusion, left foot: Secondary | ICD-10-CM | POA: Diagnosis not present

## 2018-04-26 MED ORDER — OMALIZUMAB 150 MG ~~LOC~~ SOLR: SUBCUTANEOUS | 11 refills | Status: DC

## 2018-04-26 MED ORDER — OLOPATADINE HCL 0.2 % OP SOLN: 1.0000 [drp] | Freq: Every day | OPHTHALMIC | 0 refills | Status: DC

## 2018-04-26 MED ORDER — FLUCONAZOLE 150 MG PO TABS: 150.0000 mg | ORAL_TABLET | Freq: Once | ORAL | 0 refills | Status: AC

## 2018-04-26 MED ORDER — POLYMYXIN B-TRIMETHOPRIM 10000-0.1 UNIT/ML-% OP SOLN: 1.0000 [drp] | OPHTHALMIC | 0 refills | Status: DC

## 2018-04-26 MED FILL — POLYMYXIN B/TMP EYE DROPS: 10000-0.1 | 16 days supply | Qty: 10 | Fill #0

## 2018-04-26 MED FILL — OLOPATADINE HCL 0.2% EYE DR: 0.2 | 25 days supply | Qty: 3 | Fill #0

## 2018-04-26 MED FILL — FLUCONAZOLE 150 MG TABS: 150 | 10 days supply | Qty: 10 | Fill #0

## 2018-04-26 NOTE — Progress Notes (Signed)
   S: Deborah presents to Deborah Cordova for review of their specialty medication therapy.  Deborah is currently taking Xolair for asthma. Deborah is managed by Dr. Carlis Abbott and Dr. Celedonio Miyamoto for  for this.   Adherence: has not started yet. Did not have any issues with adherence in the past.   Efficacy: worked very well for her in the past. Had to stop due to moving and losing insurance.   Dosing: Give subcutaneously.  Can be dosed every 2 or 4 weeks based on baseline serum IgE levels and body weight.  Asthma: SubQ: Dose and frequency based on body weight and pretreatment total IgE serum levels. Dosing should be adjusted during therapy for significant changes in body weight. Dosing should not be adjusted based on total IgE levels taken during treatment or <1 year following interruption of therapy. If therapy has been interrupted for ?1 year, total IgE levels may be re-evaluated for dosage determination.  Current dose: 300 mg every 4 weeks.   Dose adjustments: Renal: no dose adjustments  Hepatic: no dose adjustments  Toxicity: Severe hypersensitivity reaction or anaphylaxis: Discontinue treatment. Fever, arthralgia, and rash: Discontinue treatment if this constellation of symptoms occurs.   Monitoring: CV effects: did not have in the past Eosinophilia and vasculitis: did not have in the past Fever/arthralgia/rash: did not have in the past Hypersensitivity/Anaphylaxis: did not have in the past. Has EpiPen prescription Malignant neoplasms:did not have in the past Pulmonary function tests: completed by pulmonologist in [place]. Told she has both COPD and asthma.   O:     Lab Results  Component Value Date   WBC 9.3 10/12/2017   HGB 13.4 10/12/2017   HCT 40.2 10/12/2017   MCV 91.0 10/12/2017   PLT 318 09/29/2017      Chemistry      Component Value Date/Time   NA 142 10/12/2017 1658   K 4.0 10/12/2017 1658   CL 105 10/12/2017 1658   CO2 23 10/12/2017 1658   BUN 10  10/12/2017 1658   CREATININE 0.74 10/12/2017 1658      Component Value Date/Time   CALCIUM 9.2 10/12/2017 1658   ALKPHOS 59 04/09/2018 0949   AST 9 04/09/2018 0949   ALT 18 04/09/2018 0949   BILITOT <0.2 04/09/2018 0949       A/P: 1. Medication review: Deborah currently on Xolair for asthma. She has not started it yet but has been on in the past and tolerated it very well. Reviewed the medication with the Deborah, including the following: Xolair, omalizumab, is a novel IgE blocker.  It appears to reduce rates of hospitalizations, ER visits and unscheduled physician visits due asthma exacerbations when added to standard therapy.  Studies also show a reduction in steroid requirements and improvement in quality of life.  Deborah educated on purpose, proper use and potential adverse effects of Xolair.  Following instruction Deborah verbalized understanding. Deborah should always have an EpiPen readily available in the event of anaphylaxis. No recommendations for any changes at this time.  Christella Hartigan, PharmD, BCPS, BCACP, CPP Clinical Pharmacist Practitioner  [phone]

## 2018-04-26 NOTE — Progress Notes (Signed)
Chief Complaint  Patient presents with  . Conjunctivitis    x 2 days, itchy and soreness, crusty, running eyes.  Pt would like diflucan as dentist gave her pcn and she believes she has a yeast infection    HPI   Vaginal Irritation- acute Pt reports that she had penicillin following a dental extraction and developed vaginal itching and irritation She states that she would like diflucan  Her dentist states that she does not prescribe that She denies foul odor or dysuria  Conjunctivitis- acute Patient reports that she has a known history of allergic rhinitis and asthma She has had itchy eyes this allergy season with watering and swelling This morning she woke up with bilateral eye discharge She had to use a warm wash cloth to soak the eyes so she could open it She was previously using the underside of her shirt to scratch her eyes  Chronic persistent asthma- chronic uncontrolled She still has to use her nebulizer at least once daily She is consistent with her inhaler daily She is on symbicort, pulmicort, singulair She is await xolair  She has to meet the pharmacist   Past Medical History:  Diagnosis Date  . Asthma   . COPD (chronic obstructive pulmonary disease) ([place])   . Fibroid   . Genital warts   . Hypertension   . PID (pelvic inflammatory disease)   . Renal disorder     Current Outpatient Medications  Medication Sig Dispense Refill  . acetaminophen (TYLENOL) 500 MG tablet Take 1,000 mg by mouth every 6 (six) hours as needed for headache.    Marland Kitchen acyclovir (ZOVIRAX) 400 MG tablet Take 400 mg by mouth as needed (For fever blisters.).    Marland Kitchen albuterol (PROAIR HFA) 108 (90 Base) MCG/ACT inhaler Inhale 2 puffs into the lungs every 4 (four) hours as needed for wheezing or shortness of breath. 3 Inhaler 0  . amLODipine (NORVASC) 5 MG tablet Take 1 tablet (5 mg total) by mouth daily. 90 tablet 3  . budesonide (PULMICORT) 1 MG/2ML nebulizer solution Take as directed. 120 mL 0  .  budesonide-formoterol (SYMBICORT) 160-4.5 MCG/ACT inhaler Take 2 puffs first thing in am and then another 2 puffs about 12 hours later. 3 Inhaler 1  . Butalbital-APAP-Caffeine 50-300-40 MG CAPS Take by mouth as needed.    . cephALEXin (KEFLEX) 500 MG capsule Take 1 capsule (500 mg total) by mouth 2 (two) times daily. 6 capsule 0  . cetirizine (ZYRTEC ALLERGY) 10 MG tablet Take 10 mg by mouth daily.    . clotrimazole-betamethasone (LOTRISONE) cream Apply 1 application topically 2 (two) times daily. Apply to affected area. 30 g 0  . gabapentin (NEURONTIN) 300 MG capsule Take 1 capsule (300 mg total) by mouth at bedtime. 90 capsule 1  . gentamicin (GARAMYCIN) 0.3 % ophthalmic solution Place 1 drop into the left eye every 4 (four) hours. 5 mL 0  . HYDROcodone-acetaminophen (NORCO) 5-325 MG tablet Take 1 tablet by mouth every 6 (six) hours as needed for moderate pain. 15 tablet 0  . HYDROmorphone (DILAUDID) 2 MG tablet Take 1 tablet (2 mg total) by mouth every 4 (four) hours as needed for severe pain. 20 tablet 0  . meclizine (ANTIVERT) 25 MG tablet Take 1 tablet (25 mg total) by mouth 3 (three) times daily as needed for dizziness. 30 tablet 0  . montelukast (SINGULAIR) 10 MG tablet Take 1 tablet (10 mg total) by mouth at bedtime. 90 tablet 3  . omalizumab (XOLAIR) 150 MG  injection Inject 300 mg into the skin every 28 (twenty-eight) days. Divide doses of more than 150 mg among more than one injection site. 2 each 11  . ranitidine (ZANTAC) 150 MG tablet Take 1 tablet (150 mg total) by mouth 2 (two) times daily. 60 tablet 11  . terbinafine (LAMISIL) 250 MG tablet Take 1 tablet (250 mg total) by mouth daily. 30 tablet 0  . azithromycin (ZITHROMAX) 250 MG tablet Sig as indicated (Patient not taking: Reported on 04/12/2018) 6 tablet 0  . fluconazole (DIFLUCAN) 150 MG tablet Take 1 tablet (150 mg total) by mouth once for 1 dose. Take with antibiotic to prevent yeast vaginitis 10 tablet 0  . Olopatadine HCl 0.2 %  SOLN Apply 1 drop to eye daily. 2.5 mL 0  . ondansetron (ZOFRAN) 4 MG tablet Take 1 tablet (4 mg total) by mouth every 8 (eight) hours as needed for nausea or vomiting. (Patient not taking: Reported on 04/26/2018) 20 tablet 0  . oxyCODONE-acetaminophen (PERCOCET) 10-325 MG tablet Take 1 tablet by mouth every 4 (four) hours as needed for pain. (Patient not taking: Reported on 04/26/2018) 20 tablet 0  . trimethoprim-polymyxin b (POLYTRIM) ophthalmic solution Place 1 drop into both eyes every 4 (four) hours. 10 mL 0   Current Facility-Administered Medications  Medication Dose Route Frequency Provider Last Rate Last Dose  . albuterol (PROVENTIL) (2.5 MG/3ML) 0.083% nebulizer solution 2.5 mg  2.5 mg Nebulization Once Philis Fendt L, PA-C      . ipratropium (ATROVENT) nebulizer solution 0.5 mg  0.5 mg Nebulization Once Tereasa Coop, PA-C        Allergies:  Allergies  Allergen Reactions  . Septra [Sulfamethoxazole-Trimethoprim] Hives, Swelling and Rash  . Aspirin Other (See Comments)    Asthma flare up - wheezing  . Cantaloupe (Diagnostic) Itching and Other (See Comments)    Makes tongue itch  . Kiwi Extract Itching and Other (See Comments)    Makes tongue itch  . Nsaids Other (See Comments)    Asthma flare up - wheezing   . Other Other (See Comments)    Pecans and walnuts---induce asthma attack   . Pineapple Other (See Comments)    Tongue bleeds  . Pyridium [Phenazopyridine Hcl] Itching    Itching, swelling and burning    Past Surgical History:  Procedure Laterality Date  . COLPOSCOPY    . DILATION AND CURETTAGE OF UTERUS    . LAPAROSCOPIC TOTAL HYSTERECTOMY    . laser genital warts      Social History   Socioeconomic History  . Marital status: Married    Spouse name: Not on file  . Number of children: Not on file  . Years of education: Not on file  . Highest education level: Not on file  Occupational History  . Not on file  Social Needs  . Financial resource strain:  Not on file  . Food insecurity:    Worry: Not on file    Inability: Not on file  . Transportation needs:    Medical: Not on file    Non-medical: Not on file  Tobacco Use  . Smoking status: Never Smoker  . Smokeless tobacco: Never Used  Substance and Sexual Activity  . Alcohol use: Yes    Alcohol/week: 0.6 - 1.8 oz    Types: 1 - 3 Glasses of wine per week  . Drug use: No  . Sexual activity: Yes    Partners: Male    Birth control/protection: Surgical  Comment: hysterectomy  Lifestyle  . Physical activity:    Days per week: Not on file    Minutes per session: Not on file  . Stress: Not on file  Relationships  . Social connections:    Talks on phone: Not on file    Gets together: Not on file    Attends religious service: Not on file    Active member of club or organization: Not on file    Attends meetings of clubs or organizations: Not on file    Relationship status: Not on file  Other Topics Concern  . Not on file  Social History Narrative  . Not on file    Family History  Problem Relation Age of Onset  . Depression Mother   . Diabetes Mother   . Hepatitis C Mother   . Hypertension Mother   . Asthma Mother   . COPD Father   . Emphysema Father        smoked  . Hepatitis C Father   . Asthma Sister   . Breast cancer Sister   . Asthma Brother   . Diabetes Brother   . Hyperlipidemia Brother   . Stroke Maternal Grandmother   . Cancer Maternal Grandfather   . Cancer Paternal Grandmother   . Cancer Paternal Grandfather      ROS Review of Systems See HPI Constitution: No fevers or chills No malaise No diaphoresis Skin: No rash or itching Eyes: no blurry vision, no double vision GU: no dysuria or hematuria Neuro: no dizziness or headaches all others reviewed and negative   Objective: Vitals:   04/26/18 0836  BP: 126/85  Pulse: (!) 103  Resp: 17  Temp: 98.2 F (36.8 C)  TempSrc: Oral  SpO2: 98%  Weight: 209 lb 12.8 oz (95.2 kg)  Height: 5\' 8"   (1.727 m)    Physical Exam General: alert, oriented, in NAD Head: normocephalic, atraumatic, no sinus tenderness Eyes: EOM intact, no scleral icterus  + conjunctival injection bilaterally Ears: TM clear bilaterally Nose: mucosa nonerythematous, nonedematous Throat: no pharyngeal exudate or erythema Lymph: no posterior auricular, submental or cervical lymph adenopathy Heart: normal rate, normal sinus rhythm, no murmurs Lungs: clear to auscultation bilaterally, scant wheezing   Assessment and Plan Asianna was seen today for conjunctivitis.  Diagnoses and all orders for this visit:  Bacterial conjunctivitis- discussed home care -     trimethoprim-polymyxin b (POLYTRIM) ophthalmic solution; Place 1 drop into both eyes every 4 (four) hours.  Vaginal irritation- gave instructions on how to take with antibiotic -     fluconazole (DIFLUCAN) 150 MG tablet; Take 1 tablet (150 mg total) by mouth once for 1 dose. Take with antibiotic to prevent yeast vaginitis  Allergic conjunctivitis of both eyes Seasonal allergic rhinitis due to pollen -  Discussed contact precautions, cool compress, safe to return to work tomorrow -     Olopatadine HCl 0.2 % SOLN; Apply 1 drop to eye daily.  Severe persistent asthma without complication- continue current medications Hopefully she can get xolair and start that      Other orders      [place]

## 2018-04-26 NOTE — Patient Instructions (Addendum)
IF you received an x-ray today, you will receive an invoice from [facility]. Please contact [facility] at [phone] with questions or concerns regarding your invoice.   IF you received labwork today, you will receive an invoice from [place]. Please contact [facility] at [phone] with questions or concerns regarding your invoice.   Our billing staff will not be able to assist you with questions regarding bills from these companies.  You will be contacted with the lab results as soon as they are available. The fastest way to get your results is to activate your My Chart account. Instructions are located on the last page of this paperwork. If you have not heard from [place] regarding the results in 2 weeks, please contact this office.     Bacterial Conjunctivitis Bacterial conjunctivitis is an infection of the clear membrane that covers the white part of your eye and the inner surface of your eyelid (conjunctiva). When the blood vessels in your conjunctiva become inflamed, your eye becomes red or pink, and it will probably feel itchy. Bacterial conjunctivitis spreads very easily from person to person (is contagious). It also spreads easily from one eye to the other eye. What are the causes? This condition is caused by several common bacteria. You may get the infection if you come into close contact with another person who is infected. You may also come into contact with items that are contaminated with the bacteria, such as a face towel, contact lens solution, or eye makeup. What increases the risk? This condition is more likely to develop in people who:  Are exposed to other people who have the infection.  Wear contact lenses.  Have a sinus infection.  Have had a recent eye injury or surgery.  Have a weak body defense system (immune system).  Have a medical condition that causes dry eyes.  What are the signs or symptoms? Symptoms of this condition  include:  Eye redness.  Tearing or watery eyes.  Itchy eyes.  Burning feeling in your eyes.  Thick, yellowish discharge from an eye. This may turn into a crust on the eyelid overnight and cause your eyelids to stick together.  Swollen eyelids.  Blurred vision.  How is this diagnosed? Your health care provider can diagnose this condition based on your symptoms and medical history. Your health care provider may also take a sample of discharge from your eye to find the cause of your infection. This is rarely done. How is this treated? Treatment for this condition includes:  Antibiotic eye drops or ointment to clear the infection more quickly and prevent the spread of infection to others.  Oral antibiotic medicines to treat infections that do not respond to drops or ointments, or last longer than 10 days.  Cool, wet cloths (cool compresses) placed on the eyes.  Artificial tears applied 2-6 times a day.  Follow these instructions at home: Medicines  Take or apply your antibiotic medicine as told by your health care provider. Do not stop taking or applying the antibiotic even if you start to feel better.  Take or apply over-the-counter and prescription medicines only as told by your health care provider.  Be very careful to avoid touching the edge of your eyelid with the eye drop bottle or the ointment tube when you apply medicines to the affected eye. This will keep you from spreading the infection to your other eye or to other people. Managing discomfort  Gently wipe away any drainage from your eye  with a warm, wet washcloth or a cotton ball.  Apply a cool, clean washcloth to your eye for 10-20 minutes, 3-4 times a day. General instructions  Do not wear contact lenses until the inflammation is gone and your health care provider says it is safe to wear them again. Ask your health care provider how to sterilize or replace your contact lenses before you use them again. Wear  glasses until you can resume wearing contacts.  Avoid wearing eye makeup until the inflammation is gone. Throw away any old eye cosmetics that may be contaminated.  Change or wash your pillowcase every day.  Do not share towels or washcloths. This may spread the infection.  Wash your hands often with soap and water. Use paper towels to dry your hands.  Avoid touching or rubbing your eyes.  Do not drive or use heavy machinery if your vision is blurred. Contact a health care provider if:  You have a fever.  Your symptoms do not get better after 10 days. Get help right away if:  You have a fever and your symptoms suddenly get worse.  You have severe pain when you move your eye.  You have facial pain, redness, or swelling.  You have sudden loss of vision. This information is not intended to replace advice given to you by your health care provider. Make sure you discuss any questions you have with your health care provider. Document Released: 12/15/2005 Document Revised: 04/24/2016 Document Reviewed: 09/27/2015 Elsevier Interactive Patient Education  2017 [employer] Inc[employer]  Allergic Conjunctivitis, Adult Allergic conjunctivitis is inflammation of the clear membrane that covers the white part of your eye and the inner surface of your eyelid (conjunctiva). The inflammation is caused by allergies. The blood vessels in the conjunctiva become inflamed and this causes the eyes to become red or pink. The eyes often feel itchy. Allergic conjunctivitis cannot be spread from one person to another person (is not contagious). What are the causes? This condition is caused by an allergic reaction. Common causes of an allergic reaction (allergens) include:  Outdoor allergens, such as: ? Pollen. ? Grass and weeds. ? Mold spores.  Indoor allergens, such as: ? Dust. ? Smoke. ? Mold. ? Pet dander. ? Animal hair.  What increases the risk? You may be more likely to develop this condition if you  have a family history of allergies, such as:  Allergic rhinitis.  Bronchial asthma.  Atopic dermatitis.  What are the signs or symptoms? Symptoms of this condition include eyes that are:  Itchy.  Red.  Watery.  Puffy.  Your eyes may also:  Sting or burn.  Have clear drainage coming from them.  How is this diagnosed? This condition may be diagnosed by medical history and physical exam. If you have drainage from your eyes, it may be tested to rule out other causes of conjunctivitis. You may also need to see a health care provider who specializes in treating allergies (allergist) or eye conditions (ophthalmologist) for tests to confirm the diagnosis. You may have:  Skin tests to see which allergens are causing your symptoms. These tests involve pricking the skin with a tiny needle and exposing the skin to small amounts of potential allergens to see if your skin reacts.  Blood tests.  Tissue scrapings from your eyelid. These will be examined under a microscope.  How is this treated? Treatments for this condition may include:  Cold cloths (compresses) to soothe itching and swelling.  Washing the face to remove allergens.  Eye drops. These may be prescription or over-the-counter. There are several different types. You may need to try different types to see which one works best for you. Your may need: ? Eye drops that block the allergic reaction (antihistamine). ? Eye drops that reduce swelling and irritation (anti-inflammatory). ? Steroid eye drops to lessen a severe reaction (vernal conjunctivitis).  Oral antihistamine medicines to reduce your allergic reaction. You may need these if eye drops do not help or are difficult to use.  Follow these instructions at home:  Avoid known allergens whenever possible.  Take or apply over-the-counter and prescription medicines only as told by your health care provider. These include any eye drops.  Apply a cool, clean washcloth to  your eye for 10-20 minutes, 3-4 times a day.  Do not touch or rub your eyes.  Do not wear contact lenses until the inflammation is gone. Wear glasses instead.  Do not wear eye makeup until the inflammation is gone.  Keep all follow-up visits as told by your health care provider. This is important. Contact a health care provider if:  Your symptoms get worse or do not improve with treatment.  You have mild eye pain.  You have sensitivity to light.  You have spots or blisters on your eyes.  You have pus draining from your eye.  You have a fever. Get help right away if:  You have redness, swelling, or other symptoms in only one eye.  Your vision is blurred or you have vision changes.  You have severe eye pain. This information is not intended to replace advice given to you by your health care provider. Make sure you discuss any questions you have with your health care provider. Document Released: 03/07/2003 Document Revised: 08/13/2016 Document Reviewed: 06/27/2016 Elsevier Interactive Patient Education  2018 [employer].

## 2018-04-29 MED FILL — raNITIdine HCL 150 MG TABS: 150 | 30 days supply | Qty: 60 | Fill #2

## 2018-04-29 MED FILL — MONTELUKAST SOD 10 MG TAB: 10 | 90 days supply | Qty: 90 | Fill #1

## 2018-05-05 DIAGNOSIS — R269 Unspecified abnormalities of gait and mobility: Secondary | ICD-10-CM | POA: Diagnosis not present

## 2018-05-05 DIAGNOSIS — M25475 Effusion, left foot: Secondary | ICD-10-CM | POA: Diagnosis not present

## 2018-05-05 DIAGNOSIS — M79675 Pain in left toe(s): Secondary | ICD-10-CM | POA: Diagnosis not present

## 2018-05-05 DIAGNOSIS — M25675 Stiffness of left foot, not elsewhere classified: Secondary | ICD-10-CM | POA: Diagnosis not present

## 2018-05-06 ENCOUNTER — Encounter: Payer: Self-pay | Admitting: Family Medicine

## 2018-05-06 ENCOUNTER — Other Ambulatory Visit: Payer: Self-pay

## 2018-05-06 ENCOUNTER — Ambulatory Visit (INDEPENDENT_AMBULATORY_CARE_PROVIDER_SITE_OTHER): Payer: 59 | Admitting: Family Medicine

## 2018-05-06 VITALS — BP 107/73 | HR 97 | Temp 98.1°F | Ht 68.34 in | Wt 210.6 lb

## 2018-05-06 DIAGNOSIS — J4551 Severe persistent asthma with (acute) exacerbation: Secondary | ICD-10-CM

## 2018-05-06 DIAGNOSIS — J301 Allergic rhinitis due to pollen: Secondary | ICD-10-CM

## 2018-05-06 MED ORDER — FLUTICASONE PROPIONATE 50 MCG/ACT NA SUSP: 1.0000 | Freq: Two times a day (BID) | NASAL | 6 refills | Status: AC

## 2018-05-06 MED ORDER — ALBUTEROL SULFATE (2.5 MG/3ML) 0.083% IN NEBU
2.5000 mg | INHALATION_SOLUTION | Freq: Once | RESPIRATORY_TRACT | Status: AC
  Administered 2018-05-06: 2.5 mg via RESPIRATORY_TRACT

## 2018-05-06 MED ORDER — IPRATROPIUM BROMIDE 0.02 % IN SOLN
0.5000 mg | Freq: Once | RESPIRATORY_TRACT | Status: AC
  Administered 2018-05-06: 0.5 mg via RESPIRATORY_TRACT

## 2018-05-06 MED ORDER — PREDNISONE 20 MG PO TABS: 40.0000 mg | ORAL_TABLET | Freq: Every day | ORAL | 0 refills | Status: DC

## 2018-05-06 MED FILL — EPINEPHRINE 0.3 MG AUTO-INJ: 0.3 | 30 days supply | Qty: 2 | Fill #0

## 2018-05-06 MED FILL — XOLAIR 150 MG SOLR: 150 | 28 days supply | Qty: 2 | Fill #0

## 2018-05-06 NOTE — Patient Instructions (Signed)
     IF you received an x-ray today, you will receive an invoice from [facility]. Please contact [place] [facility] at [phone] with questions or concerns regarding your invoice.   IF you received labwork today, you will receive an invoice from [facility]. Please contact [facility] at [phone] with questions or concerns regarding your invoice.   Our billing staff will not be able to assist you with questions regarding bills from these companies.  You will be contacted with the lab results as soon as they are available. The fastest way to get your results is to activate your My Chart account. Instructions are located on the last page of this paperwork. If you have not heard from us regarding the results in 2 weeks, please contact this office.     

## 2018-05-06 NOTE — Progress Notes (Signed)
5/9/20195:56 PM  Deborah Cordova 06-23-1971, 47 y.o. female [id number]  Chief Complaint  Patient presents with  . Wheezing    HPI:   Patient is a 47 y.o. female with past medical history significant for asthma who presents today for 4 days of wheezing and SOB. Has been doing rescue inhaler about once a day plus nebs couple times a day. Last albuterol dose about 30 mins prior to this appt.   She sees pulm, xolair just got approved She is on symbicort, albuterol, ipratropium, singulair, zyrtec and zantac Diagnosed at age 65 She has a h/o intubation in her 24s Last hosp about 4 years ago Has been uncontrolled for about a year Triggers smells, seasonal allergies, NSAIDs and ASA She reports itchy eyes, sneezing  Fall Risk  05/06/2018 04/26/2018 04/12/2018 03/24/2018 12/28/2017  Falls in the past year? No No No No No     Depression screen [facility] 2/9 05/06/2018 04/26/2018 04/12/2018  Decreased Interest 0 0 0  Down, Depressed, Hopeless 0 0 0  PHQ - 2 Score 0 0 0    Allergies  Allergen Reactions  . Septra [Sulfamethoxazole-Trimethoprim] Hives, Swelling and Rash  . Aspirin Other (See Comments)    Asthma flare up - wheezing  . Cantaloupe (Diagnostic) Itching and Other (See Comments)    Makes tongue itch  . Kiwi Extract Itching and Other (See Comments)    Makes tongue itch  . Nsaids Other (See Comments)    Asthma flare up - wheezing   . Other Other (See Comments)    Pecans and walnuts---induce asthma attack   . Pineapple Other (See Comments)    Tongue bleeds  . Pyridium [Phenazopyridine Hcl] Itching    Itching, swelling and burning    Prior to Admission medications   Medication Sig Start Date End Date Taking? Authorizing Provider  acyclovir (ZOVIRAX) 400 MG tablet Take 400 mg by mouth as needed (For fever blisters.).    [provider]  albuterol (PROAIR HFA) 108 (90 Base) MCG/ACT inhaler Inhale 2 puffs into the lungs every 4 (four) hours as needed for wheezing or shortness  of breath. 10/12/17   Forrest Moron, MD  amLODipine (NORVASC) 5 MG tablet Take 1 tablet (5 mg total) by mouth daily. 12/14/17   Forrest Moron, MD  budesonide-formoterol (SYMBICORT) 160-4.5 MCG/ACT inhaler Take 2 puffs first thing in am and then another 2 puffs about 12 hours later. 12/14/17   Tereasa Coop, PA-C  Butalbital-APAP-Caffeine 50-300-40 MG CAPS Take by mouth as needed.    [provider]  cetirizine (ZYRTEC ALLERGY) 10 MG tablet Take 10 mg by mouth daily.    [provider]  gabapentin (NEURONTIN) 300 MG capsule Take 1 capsule (300 mg total) by mouth at bedtime. 09/29/17   Forrest Moron, MD  montelukast (SINGULAIR) 10 MG tablet Take 1 tablet (10 mg total) by mouth at bedtime. 10/12/17   Forrest Moron, MD  Olopatadine HCl 0.2 % SOLN Apply 1 drop to eye daily. 04/26/18   Forrest Moron, MD  omalizumab Arvid Right) 150 MG injection Inject 300 mg under the skin every 28 days. Divide doses of more than 150 mg among more than 1 injection site. 04/26/18   Tresa Garter, MD  ranitidine (ZANTAC) 150 MG tablet Take 1 tablet (150 mg total) by mouth 2 (two) times daily. 02/03/18   Forrest Moron, MD  trimethoprim-polymyxin b (POLYTRIM) ophthalmic solution Place 1 drop into both eyes every 4 (four) hours. 04/26/18  Forrest Moron, MD    Past Medical History:  Diagnosis Date  . Asthma   . COPD (chronic obstructive pulmonary disease) ([place])   . Fibroid   . Genital warts   . Hypertension   . PID (pelvic inflammatory disease)   . Renal disorder     Past Surgical History:  Procedure Laterality Date  . COLPOSCOPY    . DILATION AND CURETTAGE OF UTERUS    . LAPAROSCOPIC TOTAL HYSTERECTOMY    . laser genital warts      Social History   Tobacco Use  . Smoking status: Never Smoker  . Smokeless tobacco: Never Used  Substance Use Topics  . Alcohol use: Yes    Alcohol/week: 0.6 - 1.8 oz    Types: 1 - 3 Glasses of wine per week    Family History  Problem  Relation Age of Onset  . Depression Mother   . Diabetes Mother   . Hepatitis C Mother   . Hypertension Mother   . Asthma Mother   . COPD Father   . Emphysema Father        smoked  . Hepatitis C Father   . Asthma Sister   . Breast cancer Sister   . Asthma Brother   . Diabetes Brother   . Hyperlipidemia Brother   . Stroke Maternal Grandmother   . Cancer Maternal Grandfather   . Cancer Paternal Grandmother   . Cancer Paternal Grandfather     Review of Systems  Constitutional: Negative for chills and fever.  HENT: Positive for congestion. Negative for ear pain, sinus pain and sore throat.   Respiratory: Positive for shortness of breath and wheezing. Negative for cough and sputum production.   Endo/Heme/Allergies: Positive for environmental allergies.     OBJECTIVE:  Blood pressure 107/73, pulse 97, temperature 98.1 F (36.7 C), temperature source Oral, height 5' 8.34" (1.736 m), weight 210 lb 9.6 oz (95.5 kg), last menstrual period 03/29/2016, SpO2 98 %.  Physical Exam  Constitutional: She is oriented to person, place, and time. She appears well-developed and well-nourished.  HENT:  Head: Normocephalic and atraumatic.  Right Ear: Hearing, tympanic membrane, external ear and ear canal normal.  Left Ear: Hearing, tympanic membrane, external ear and ear canal normal.  Nose: Mucosal edema and rhinorrhea present.  Mouth/Throat: Oropharynx is clear and moist.  Eyes: Pupils are equal, round, and reactive to light. Conjunctivae and EOM are normal.  Neck: Neck supple.  Cardiovascular: Normal rate, regular rhythm and normal heart sounds. Exam reveals no gallop and no friction rub.  No murmur heard. Pulmonary/Chest: Effort normal. She has decreased breath sounds (poor air movement). She has no wheezes. She has no rhonchi. She has no rales.  Speaking in full sentences Improved air movement after duoneb  Lymphadenopathy:    She has no cervical adenopathy.  Neurological: She is alert  and oriented to person, place, and time.  Skin: Skin is warm and dry.  Nursing note and vitals reviewed.   ASSESSMENT and PLAN  1. Severe persistent asthma with acute exacerbation Patient with exacerbation of asthma in setting of poorly controlled allergies. meds per below. R/se/b reviewed. FU with pulm and PCP as scheduled. RTC precautions reviewed.  - albuterol (PROVENTIL) (2.5 MG/3ML) 0.083% nebulizer solution 2.5 mg - ipratropium (ATROVENT) nebulizer solution 0.5 mg  2. Seasonal allergic rhinitis due to pollen  Other orders - fluticasone (FLONASE) 50 MCG/ACT nasal spray; Place 1 spray into both nostrils 2 (two) times daily. - predniSONE (DELTASONE) 20  MG tablet; Take 2 tablets (40 mg total) by mouth daily with breakfast.  Return in about 1 week (around 05/13/2018).    Rutherford Guys, MD Primary Care at [facility] [place], [place] [address] Ph.  [phone] Fax [phone]

## 2018-05-07 MED FILL — FLUTICASONE PROP 50 MCG SPR: 50 | 30 days supply | Qty: 16 | Fill #0

## 2018-05-07 MED FILL — predniSONE 20 MG TABS: 20 | 5 days supply | Qty: 10 | Fill #0

## 2018-05-10 DIAGNOSIS — M79675 Pain in left toe(s): Secondary | ICD-10-CM | POA: Diagnosis not present

## 2018-05-10 DIAGNOSIS — M25475 Effusion, left foot: Secondary | ICD-10-CM | POA: Diagnosis not present

## 2018-05-10 DIAGNOSIS — R269 Unspecified abnormalities of gait and mobility: Secondary | ICD-10-CM | POA: Diagnosis not present

## 2018-05-10 DIAGNOSIS — M25675 Stiffness of left foot, not elsewhere classified: Secondary | ICD-10-CM | POA: Diagnosis not present

## 2018-05-10 MED FILL — WATER FOR INJECTION VIAL: 30 days supply | Qty: 10 | Fill #0

## 2018-05-12 ENCOUNTER — Other Ambulatory Visit: Payer: Self-pay | Admitting: Internal Medicine

## 2018-05-12 MED ORDER — OMALIZUMAB 150 MG ~~LOC~~ SOLR: SUBCUTANEOUS | 11 refills | Status: AC

## 2018-05-17 ENCOUNTER — Encounter: Payer: Self-pay | Admitting: Family Medicine

## 2018-05-20 ENCOUNTER — Encounter: Payer: Self-pay | Admitting: Family Medicine

## 2018-06-01 ENCOUNTER — Ambulatory Visit (INDEPENDENT_AMBULATORY_CARE_PROVIDER_SITE_OTHER): Payer: 59 | Admitting: Physician Assistant

## 2018-06-01 ENCOUNTER — Encounter: Payer: Self-pay | Admitting: Physician Assistant

## 2018-06-01 VITALS — BP 116/60 | Resp 16 | Ht 68.0 in | Wt 211.6 lb

## 2018-06-01 DIAGNOSIS — L309 Dermatitis, unspecified: Secondary | ICD-10-CM

## 2018-06-01 MED ORDER — TRIAMCINOLONE ACETONIDE 0.1 % EX OINT: 1.0000 "application " | TOPICAL_OINTMENT | Freq: Two times a day (BID) | CUTANEOUS | 0 refills | Status: AC

## 2018-06-01 NOTE — Patient Instructions (Addendum)
You can continue the ear oil, and use it more often. You could also try sweet oil in the ear. If that doesn't work, apply a small amount of steroid ointment to the outer ear canal WITH YOUR FINGERTIP. You body heat will melt it, allowing in to go into the ear canal and reduce the itching.  If the steroid ointment causes the rash to get worse, then we will switch to an antifungal product.    IF you received an x-ray today, you will receive an invoice from [facility]. Please contact [facility] at [phone] with questions or concerns regarding your invoice.   IF you received labwork today, you will receive an invoice from [place]. Please contact [facility] at [phone] with questions or concerns regarding your invoice.   Our billing staff will not be able to assist you with questions regarding bills from these companies.  You will be contacted with the lab results as soon as they are available. The fastest way to get your results is to activate your My Chart account. Instructions are located on the last page of this paperwork. If you have not heard from [place] regarding the results in 2 weeks, please contact this office.

## 2018-06-01 NOTE — Progress Notes (Signed)
Patient ID: Deborah Cordova, female    DOB: 1971-08-15, 47 y.o.   MRN: [id number]  PCP: Forrest Moron, MD  Chief Complaint  Patient presents with  . Tinea    inner right arm noticed a few days ago   . Ear Pain    right ear hurts and both itch in the canal, right side worse     Subjective:   Presents for evaluation of a spot of presumed ringworm on the upper inner RIGHT arm, and chronically itchy ears.  1. RIGHT upper inner arm, itchy place for several days. Similar to lesions recently on the legs, improved with antifungal cream. Chart reviewed. Orders only encounter 02/20/2018, Lotrisone cream prescribed.  2. Itchy ears, bilateral, for months. R>L. Used ear oil (purchased on [employer]) with temporary relief. Scale. Soreness with constant scratching. Rarely uses Q-tips, and then only externally. Uses H2O2 + H2O once a month to clean the ears.  She has known difficult to control asthma and allergic rhinitis. Her daughter has severe eczema.  Review of Systems As above    Patient Active Problem List   Diagnosis Date Noted  . Severe persistent asthma without complication   . Essential hypertension 10/01/2015  . Gastroesophageal reflux disease without esophagitis 10/01/2015     Prior to Admission medications   Medication Sig Start Date Authorizing Provider  albuterol (ACCUNEB) 0.63 MG/3ML nebulizer solution Inhale into the lungs. 01/03/16 [provider]  ipratropium (ATROVENT) 0.03 % nasal spray Place into the nose. 05/07/17 [provider]  acyclovir (ZOVIRAX) 400 MG tablet Take 400 mg by mouth as needed (For fever blisters.).  [provider]  albuterol (PROAIR HFA) 108 (90 Base) MCG/ACT inhaler Inhale 2 puffs into the lungs every 4 (four) hours as needed for wheezing or shortness of breath. 10/12/17 Forrest Moron, MD  amLODipine (NORVASC) 5 MG tablet Take 1 tablet (5 mg total) by mouth daily. 12/14/17 Forrest Moron, MD  B-D 3CC LUER-LOK SYR  25GX1" 25G X 1" 3 ML MISC  05/28/18 [provider]  budesonide-formoterol (SYMBICORT) 160-4.5 MCG/ACT inhaler Take 2 puffs first thing in am and then another 2 puffs about 12 hours later. 12/14/17 Tereasa Coop, PA-C  Butalbital-APAP-Caffeine 50-300-40 MG CAPS Take by mouth as needed.  [provider]  cetirizine (ZYRTEC ALLERGY) 10 MG tablet Take 10 mg by mouth daily.  [provider]  EPINEPHrine 0.3 mg/0.3 mL IJ SOAJ injection  05/06/18 [provider]  fluticasone (FLONASE) 50 MCG/ACT nasal spray Place 1 spray into both nostrils 2 (two) times daily. 05/06/18 Rutherford Guys, MD  gabapentin (NEURONTIN) 300 MG capsule Take 1 capsule (300 mg total) by mouth at bedtime. 09/29/17 Forrest Moron, MD  montelukast (SINGULAIR) 10 MG tablet Take 1 tablet (10 mg total) by mouth at bedtime. 10/12/17 Forrest Moron, MD  omalizumab Arvid Right) 150 MG injection Inject 300 mg under the skin every 2 weeks. 05/12/18 Tresa Garter, MD  ranitidine (ZANTAC) 150 MG tablet Take 1 tablet (150 mg total) by mouth 2 (two) times daily. 02/03/18 Forrest Moron, MD  Water For Injection Sterile (STERILE WATER, PRESERVATIVE FREE,) injection  05/10/18 [provider]     Allergies  Allergen Reactions  . Aspirin Other (See Comments) and Shortness Of Breath    Asthma flare up - wheezing Asthma attack  . Nsaids Other (See Comments) and Shortness Of Breath    Asthma flare up - wheezing  Asthma attack  . Sulfamethoxazole-Trimethoprim Hives,  Swelling, Rash and Other (See Comments)    Whelps and knots   . Tolmetin Shortness Of Breath    Asthma flare up - wheezing  . Cantaloupe (Diagnostic) Itching and Other (See Comments)    Makes tongue itch  . Other Other (See Comments)    Pecans and walnuts---induce asthma attack   . Pineapple Other (See Comments)    Tongue bleeds  . Kiwi Extract Itching and Other (See Comments)    Makes tongue itch Makes tongue itch  . Pecan  Extract Allergy Skin Test Itching    Pecans and walnuts---induce asthma attack   . Phenazopyridine Hcl Itching    Itching, swelling and burning Itching, swelling and burning       Objective:  Physical Exam  Constitutional: She is oriented to person, place, and time. She appears well-developed and well-nourished. She is active and cooperative. No distress.  BP 116/60   Resp 16   Ht 5\' 8"  (1.727 m)   Wt 211 lb 9.6 oz (96 kg)   LMP 03/29/2016 (Approximate)   BMI 32.17 kg/m    HENT:  Right Ear: Hearing and tympanic membrane normal.  Left Ear: Hearing and tympanic membrane normal.  Ears:  Eyes: Conjunctivae are normal.  Neck: Neck supple. No thyromegaly present.  Pulmonary/Chest: Effort normal.  Lymphadenopathy:       Head (right side): No submental, no submandibular, no tonsillar, no preauricular and no posterior auricular adenopathy present.       Head (left side): No submental, no submandibular, no tonsillar, no preauricular and no posterior auricular adenopathy present.    She has no cervical adenopathy.  Neurological: She is alert and oriented to person, place, and time.  Skin:     Psychiatric: She has a normal mood and affect. Her speech is normal and behavior is normal.           Assessment & Plan:   1. Dermatitis Doubt tinea is the cause of the lesion on the RIGHT arm. If steroid causes worsening, then would use ketoconazole. OK to continue OTC ear drops, or sweet oil drops. If ineffective, would try the steroid ointment. - triamcinolone ointment (KENALOG) 0.1 %; Apply 1 application topically 2 (two) times daily.  Dispense: 30 g; Refill: 0    No follow-ups on file.   Fara Chute, PA-C Primary Care at [place]

## 2018-06-02 MED FILL — TRIAMCINOLONE 0.1% OINTMENT: 0.1 | 15 days supply | Qty: 30 | Fill #0

## 2018-06-08 MED FILL — raNITIdine HCL 150 MG TABS: 150 | 30 days supply | Qty: 60 | Fill #3

## 2018-06-08 MED FILL — FLUTICASONE PROP 50 MCG SPR: 50 | 30 days supply | Qty: 16 | Fill #1

## 2018-06-08 MED FILL — VENTOLIN HFA 90 MCG INHALER: 108 (90 BAS | 75 days supply | Qty: 54 | Fill #2 | Status: TO

## 2018-06-08 MED FILL — GABAPENTIN 300 MG CAPSULE: 300 | 90 days supply | Qty: 90 | Fill #0

## 2018-06-08 MED FILL — SYMBICORT 160-4.5 MCG INH: 160-4.5 | 90 days supply | Qty: 31 | Fill #1

## 2018-06-08 MED FILL — XOLAIR 150 MG SOLR: 150 | 28 days supply | Qty: 2 | Fill #1

## 2018-06-12 ENCOUNTER — Ambulatory Visit: Payer: 59 | Admitting: Physician Assistant

## 2018-06-14 ENCOUNTER — Ambulatory Visit: Payer: 59 | Admitting: Physician Assistant

## 2018-06-15 ENCOUNTER — Encounter: Payer: Self-pay | Admitting: Physician Assistant

## 2018-06-15 ENCOUNTER — Ambulatory Visit (INDEPENDENT_AMBULATORY_CARE_PROVIDER_SITE_OTHER): Payer: 59 | Admitting: Physician Assistant

## 2018-06-15 ENCOUNTER — Other Ambulatory Visit: Payer: Self-pay

## 2018-06-15 VITALS — BP 126/70 | HR 88 | Temp 98.0°F | Resp 16

## 2018-06-15 DIAGNOSIS — R350 Frequency of micturition: Secondary | ICD-10-CM

## 2018-06-15 DIAGNOSIS — R82998 Other abnormal findings in urine: Secondary | ICD-10-CM

## 2018-06-15 LAB — POC MICROSCOPIC URINALYSIS (UMFC): MUCUS RE: ABSENT

## 2018-06-15 LAB — POCT URINALYSIS DIP (MANUAL ENTRY)
Bilirubin, UA: NEGATIVE
Glucose, UA: NEGATIVE mg/dL
Ketones, POC UA: NEGATIVE mg/dL
NITRITE UA: NEGATIVE
Protein Ur, POC: NEGATIVE mg/dL
SPEC GRAV UA: 1.01 (ref 1.010–1.025)
UROBILINOGEN UA: 0.2 U/dL
pH, UA: 5.5 (ref 5.0–8.0)

## 2018-06-15 MED ORDER — NITROFURANTOIN MONOHYD MACRO 100 MG PO CAPS: 100.0000 mg | ORAL_CAPSULE | Freq: Two times a day (BID) | ORAL | 0 refills | Status: AC

## 2018-06-15 NOTE — Progress Notes (Signed)
06/15/2018 at 6:19 PM  Deborah Cordova / DOB: 10-26-1971 / MRN: [id number]  The patient has Essential hypertension; Severe persistent asthma without complication; and Gastroesophageal reflux disease without esophagitis on their problem list.  SUBJECTIVE  Deborah Cordova is a 47 y.o. female who complains of urinary frequency and off urinary appearance x 3 days. She denies dysuria, hematuria, urinary urgency, flank pain, abdominal pain, pelvic pain, genital rash, genital irritation and vaginal discharge. Has not tried anything for relief. Most recent UTI prior to this was few years ago. Sexually active with monogamous partner. No concern for STDs. PSH of hysterectomy.   She  has a past medical history of Asthma, COPD (chronic obstructive pulmonary disease) ([place]), Fibroid, Genital warts, Hypertension, PID (pelvic inflammatory disease), and Renal disorder.    Medications reviewed and updated by myself where necessary, and exist elsewhere in the encounter.   Deborah Cordova is allergic to aspirin; nsaids; sulfamethoxazole-trimethoprim; tolmetin; cantaloupe (diagnostic); other; pineapple; kiwi extract; pecan extract allergy skin test; and phenazopyridine hcl. She  reports that she has never smoked. She has never used smokeless tobacco. She reports that she drinks about 0.6 - 1.8 oz of alcohol per week. She reports that she does not use drugs. She  reports that she currently engages in sexual activity and has had partners who are Female. She reports using the following method of birth control/protection: Surgical. The patient  has a past surgical history that includes Dilation and curettage of uterus; Colposcopy; laser genital warts; and Laparoscopic total hysterectomy.  Her family history includes Asthma in her brother, mother, and sister; Breast cancer in her sister; COPD in her father; Cancer in her maternal grandfather, paternal grandfather, and paternal grandmother; Depression in her mother; Diabetes in her  brother and mother; Emphysema in her father; Hepatitis C in her father and mother; Hyperlipidemia in her brother; Hypertension in her mother; Stroke in her maternal grandmother.  Review of Systems  Constitutional: Negative for chills, diaphoresis and fever.  Gastrointestinal: Negative for nausea and vomiting.    OBJECTIVE  Her  oral temperature is 98 F (36.7 C). Her blood pressure is 126/70 and her pulse is 88. Her respiration is 16 and oxygen saturation is 98%.  The patient's body mass index is unknown because there is no height or weight on file.  Physical Exam  Constitutional: She is oriented to person, place, and time. She appears well-developed and well-nourished. No distress.  HENT:  Head: Normocephalic and atraumatic.  Eyes: Conjunctivae are normal.  Neck: Normal range of motion.  Pulmonary/Chest: Effort normal.  Abdominal: Soft. Normal appearance. There is no tenderness. There is no CVA tenderness.  Neurological: She is alert and oriented to person, place, and time.  Skin: Skin is warm and dry.  Psychiatric: She has a normal mood and affect.  Vitals reviewed.   Results for orders placed or performed in visit on 06/15/18 (from the past 24 hour(s))  POCT urinalysis dipstick     Status: Abnormal   Collection Time: 06/15/18  5:53 PM  Result Value Ref Range   Color, UA yellow yellow   Clarity, UA clear clear   Glucose, UA negative negative mg/dL   Bilirubin, UA negative negative   Ketones, POC UA negative negative mg/dL   Spec Grav, UA 1.010 1.010 - 1.025   Blood, UA trace-intact (A) negative   pH, UA 5.5 5.0 - 8.0   Protein Ur, POC negative negative mg/dL   Urobilinogen, UA 0.2 0.2 or 1.0 E.U./dL   Nitrite,  UA Negative Negative   Leukocytes, UA Trace (A) Negative  POCT Microscopic Urinalysis ([facility])     Status: Abnormal   Collection Time: 06/15/18  6:00 PM  Result Value Ref Range   WBC,UR,HPF,POC Moderate (A) None WBC/hpf   RBC,UR,HPF,POC None None RBC/hpf    Bacteria Many (A) None, Too numerous to count   Mucus Absent Absent   Epithelial Cells, UR Per Microscopy Many (A) None, Too numerous to count cells/hpf    ASSESSMENT & PLAN  Deborah Cordova was seen today for urinary frequency.  Diagnoses and all orders for this visit:  Urinary frequency -     POCT urinalysis dipstick -     POCT Microscopic Urinalysis ([facility])  Leukocytes in urine -     Urine Culture -     nitrofurantoin, macrocrystal-monohydrate, (MACROBID) 100 MG capsule; Take 1 capsule (100 mg total) by mouth 2 (two) times daily for 5 days.   Hx, UA, and urine micro suggestive of UTI. Will treat empirically at this time. Urine cx pending. The patient was advised to call or come back to clinic if she does not see an improvement in symptoms, or worsens with the above plan.   Deborah Delaine, PA-C  [facility] 06/15/2018 6:19 PM

## 2018-06-15 NOTE — Patient Instructions (Addendum)
  Your results indicate you have a UTI. I have given you a prescription for an antibiotic. Please take with food. I have sent off a urine culture and we should have those results in 48 hours. If your symptoms worsen while you are awaiting these results or you develop fever, chills, flank pian, nausea and vomiting, please seek care immediately.    Urinary Tract Infection, Adult A urinary tract infection (UTI) is an infection of any part of the urinary tract. The urinary tract includes the:  Kidneys.  Ureters.  Bladder.  Urethra.  These organs make, store, and get rid of pee (urine) in the body. Follow these instructions at home:  Take over-the-counter and prescription medicines only as told by your doctor.  If you were prescribed an antibiotic medicine, take it as told by your doctor. Do not stop taking the antibiotic even if you start to feel better.  Avoid the following drinks: ? Alcohol. ? Caffeine. ? Tea. ? Carbonated drinks.  Drink enough fluid to keep your pee clear or pale yellow.  Keep all follow-up visits as told by your doctor. This is important.  Make sure to: ? Empty your bladder often and completely. Do not to hold pee for long periods of time. ? Empty your bladder before and after sex. ? Wipe from front to back after a bowel movement if you are female. Use each tissue one time when you wipe. Contact a doctor if:  You have back pain.  You have a fever.  You feel sick to your stomach (nauseous).  You throw up (vomit).  Your symptoms do not get better after 3 days.  Your symptoms go away and then come back. Get help right away if:  You have very bad back pain.  You have very bad lower belly (abdominal) pain.  You are throwing up and cannot keep down any medicines or water. This information is not intended to replace advice given to you by your health care provider. Make sure you discuss any questions you have with your health care provider. Document  Released: 06/02/2008 Document Revised: 05/22/2016 Document Reviewed: 11/05/2015 Elsevier Interactive Patient Education  2018 Elsevier Inc.    IF you received an x-ray today, you will receive an invoice from [facility]. Please contact [facility] at [phone] with questions or concerns regarding your invoice.   IF you received labwork today, you will receive an invoice from [facility]. Please contact [facility] at [phone] with questions or concerns regarding your invoice.   Our billing staff will not be able to assist you with questions regarding bills from these companies.  You will be contacted with the lab results as soon as they are available. The fastest way to get your results is to activate your My Chart account. Instructions are located on the last page of this paperwork. If you have not heard from us regarding the results in 2 weeks, please contact this office.     

## 2018-06-16 LAB — URINE CULTURE

## 2018-06-16 NOTE — Progress Notes (Signed)
Chief Complaint  Patient presents with  . Medical Clearance    HPI   Pt has surgery 07/13/18 She is going to be having cosmetic surgery She denies any acute changes     Past Medical History:  Diagnosis Date  . Asthma   . COPD (chronic obstructive pulmonary disease) ([place])   . Fibroid   . Genital warts   . Hypertension   . PID (pelvic inflammatory disease)   . Renal disorder     Current Outpatient Medications  Medication Sig Dispense Refill  . acyclovir (ZOVIRAX) 400 MG tablet Take 400 mg by mouth as needed (For fever blisters.).    Marland Kitchen albuterol (ACCUNEB) 0.63 MG/3ML nebulizer solution Inhale into the lungs.    Marland Kitchen albuterol (PROAIR HFA) 108 (90 Base) MCG/ACT inhaler Inhale 2 puffs into the lungs every 4 (four) hours as needed for wheezing or shortness of breath. 3 Inhaler 0  . amLODipine (NORVASC) 5 MG tablet Take 1 tablet (5 mg total) by mouth daily. 90 tablet 3  . B-D 3CC LUER-LOK SYR 25GX1" 25G X 1" 3 ML MISC   1  . budesonide-formoterol (SYMBICORT) 160-4.5 MCG/ACT inhaler Take 2 puffs first thing in am and then another 2 puffs about 12 hours later. 3 Inhaler 1  . Butalbital-APAP-Caffeine 50-300-40 MG CAPS Take by mouth as needed.    . cetirizine (ZYRTEC ALLERGY) 10 MG tablet Take 10 mg by mouth daily.    Marland Kitchen EPINEPHrine 0.3 mg/0.3 mL IJ SOAJ injection   0  . fluticasone (FLONASE) 50 MCG/ACT nasal spray Place 1 spray into both nostrils 2 (two) times daily. 16 g 6  . gabapentin (NEURONTIN) 300 MG capsule Take 1 capsule (300 mg total) by mouth at bedtime. 90 capsule 1  . ipratropium (ATROVENT) 0.03 % nasal spray Place into the nose.    . montelukast (SINGULAIR) 10 MG tablet Take 1 tablet (10 mg total) by mouth at bedtime. 90 tablet 3  . nitrofurantoin, macrocrystal-monohydrate, (MACROBID) 100 MG capsule Take 1 capsule (100 mg total) by mouth 2 (two) times daily for 5 days. 10 capsule 0  . omalizumab (XOLAIR) 150 MG injection Inject 300 mg under the skin every 2 weeks. 4 vial 11    . ranitidine (ZANTAC) 150 MG tablet Take 1 tablet (150 mg total) by mouth 2 (two) times daily. 60 tablet 11  . triamcinolone ointment (KENALOG) 0.1 % Apply 1 application topically 2 (two) times daily. 30 g 0  . Water For Injection Sterile (STERILE WATER, PRESERVATIVE FREE,) injection   3   No current facility-administered medications for this visit.     Allergies:  Allergies  Allergen Reactions  . Aspirin Other (See Comments) and Shortness Of Breath    Asthma flare up - wheezing Asthma attack  . Nsaids Other (See Comments) and Shortness Of Breath    Asthma flare up - wheezing  Asthma attack  . Sulfamethoxazole-Trimethoprim Hives, Swelling, Rash and Other (See Comments)    Whelps and knots   . Tolmetin Shortness Of Breath    Asthma flare up - wheezing  . Cantaloupe (Diagnostic) Itching and Other (See Comments)    Makes tongue itch  . Other Other (See Comments)    Pecans and walnuts---induce asthma attack   . Pineapple Other (See Comments)    Tongue bleeds  . Kiwi Extract Itching and Other (See Comments)    Makes tongue itch Makes tongue itch  . Pecan Extract Allergy Skin Test Itching    Pecans and walnuts---induce asthma attack   .  Phenazopyridine Hcl Itching    Itching, swelling and burning Itching, swelling and burning    Past Surgical History:  Procedure Laterality Date  . COLPOSCOPY    . DILATION AND CURETTAGE OF UTERUS    . LAPAROSCOPIC TOTAL HYSTERECTOMY    . laser genital warts      Social History   Socioeconomic History  . Marital status: Married    Spouse name: Not on file  . Number of children: Not on file  . Years of education: Not on file  . Highest education level: Not on file  Occupational History  . Not on file  Social Needs  . Financial resource strain: Not on file  . Food insecurity:    Worry: Not on file    Inability: Not on file  . Transportation needs:    Medical: Not on file    Non-medical: Not on file  Tobacco Use  . Smoking  status: Never Smoker  . Smokeless tobacco: Never Used  Substance and Sexual Activity  . Alcohol use: Yes    Alcohol/week: 0.6 - 1.8 oz    Types: 1 - 3 Glasses of wine per week  . Drug use: No  . Sexual activity: Yes    Partners: Male    Birth control/protection: Surgical    Comment: hysterectomy  Lifestyle  . Physical activity:    Days per week: Not on file    Minutes per session: Not on file  . Stress: Not on file  Relationships  . Social connections:    Talks on phone: Not on file    Gets together: Not on file    Attends religious service: Not on file    Active member of club or organization: Not on file    Attends meetings of clubs or organizations: Not on file    Relationship status: Not on file  Other Topics Concern  . Not on file  Social History Narrative  . Not on file    Family History  Problem Relation Age of Onset  . Depression Mother   . Diabetes Mother   . Hepatitis C Mother   . Hypertension Mother   . Asthma Mother   . COPD Father   . Emphysema Father        smoked  . Hepatitis C Father   . Asthma Sister   . Breast cancer Sister   . Asthma Brother   . Diabetes Brother   . Hyperlipidemia Brother   . Stroke Maternal Grandmother   . Cancer Maternal Grandfather   . Cancer Paternal Grandmother   . Cancer Paternal Grandfather      ROS Review of Systems See HPI Constitution: No fevers or chills No malaise No diaphoresis Skin: No rash or itching Eyes: no blurry vision, no double vision GU: no dysuria or hematuria Neuro: no dizziness or headaches all others reviewed and negative   Objective: Vitals:   06/17/18 0918  BP: 119/82  Pulse: 75  Resp: 17  Temp: 98.5 F (36.9 C)  TempSrc: Oral  SpO2: 99%  Weight: 210 lb 12.8 oz (95.6 kg)  Height: 5\' 8"  (1.727 m)    Physical Exam  Constitutional: She is oriented to person, place, and time. She appears well-developed and well-nourished.  HENT:  Head: Normocephalic and atraumatic.  Right  Ear: External ear normal.  Left Ear: External ear normal.  Nose: Nose normal.  Mouth/Throat: Oropharynx is clear and moist.  Eyes: Pupils are equal, round, and reactive to light. Conjunctivae and EOM  are normal.  Neck: Normal range of motion. Neck supple. No thyromegaly present.  Cardiovascular: Normal rate, regular rhythm, normal heart sounds and intact distal pulses.  No murmur heard. Pulmonary/Chest: Effort normal and breath sounds normal. No stridor. No respiratory distress. She has no wheezes. She has no rales.  Abdominal: Soft. Bowel sounds are normal. She exhibits no distension and no mass. There is no tenderness. There is no rebound and no guarding. No hernia.  Musculoskeletal: Normal range of motion. She exhibits no edema or deformity.  Neurological: She is alert and oriented to person, place, and time. She displays normal reflexes. No cranial nerve deficit. Coordination normal.  Skin: Skin is warm. Capillary refill takes less than 2 seconds.  Psychiatric: She has a normal mood and affect. Her behavior is normal. Judgment and thought content normal.   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  Diagnoses and all orders for this visit:  Preoperative testing -     EKG 12-Lead -     POCT urine pregnancy -     CBC -     Basic metabolic panel -     HIV antibody -     Protime-INR -     PT and PTT -     POCT urinalysis dipstick   Pt with history of asthma that is currently stable here for preop testing Testing completed [place]

## 2018-06-17 ENCOUNTER — Other Ambulatory Visit: Payer: Self-pay

## 2018-06-17 ENCOUNTER — Encounter: Payer: Self-pay | Admitting: Family Medicine

## 2018-06-17 ENCOUNTER — Ambulatory Visit (INDEPENDENT_AMBULATORY_CARE_PROVIDER_SITE_OTHER): Payer: 59 | Admitting: Orthotics

## 2018-06-17 ENCOUNTER — Ambulatory Visit (INDEPENDENT_AMBULATORY_CARE_PROVIDER_SITE_OTHER): Payer: 59 | Admitting: Family Medicine

## 2018-06-17 ENCOUNTER — Ambulatory Visit (INDEPENDENT_AMBULATORY_CARE_PROVIDER_SITE_OTHER): Payer: 59 | Admitting: Podiatry

## 2018-06-17 VITALS — BP 119/82 | HR 75 | Temp 98.5°F | Resp 17 | Ht 68.0 in | Wt 210.8 lb

## 2018-06-17 DIAGNOSIS — B351 Tinea unguium: Secondary | ICD-10-CM | POA: Diagnosis not present

## 2018-06-17 DIAGNOSIS — M7752 Other enthesopathy of left foot: Secondary | ICD-10-CM | POA: Diagnosis not present

## 2018-06-17 DIAGNOSIS — M21619 Bunion of unspecified foot: Secondary | ICD-10-CM | POA: Diagnosis not present

## 2018-06-17 DIAGNOSIS — M7751 Other enthesopathy of right foot: Secondary | ICD-10-CM

## 2018-06-17 DIAGNOSIS — Z01818 Encounter for other preprocedural examination: Secondary | ICD-10-CM | POA: Diagnosis not present

## 2018-06-17 LAB — POCT URINALYSIS DIP (MANUAL ENTRY)
BILIRUBIN UA: NEGATIVE
BILIRUBIN UA: NEGATIVE mg/dL
Blood, UA: NEGATIVE
GLUCOSE UA: NEGATIVE mg/dL
LEUKOCYTES UA: NEGATIVE
NITRITE UA: NEGATIVE
Protein Ur, POC: NEGATIVE mg/dL
Spec Grav, UA: 1.015 (ref 1.010–1.025)
Urobilinogen, UA: 0.2 E.U./dL
pH, UA: 8 (ref 5.0–8.0)

## 2018-06-17 LAB — POCT URINE PREGNANCY: Preg Test, Ur: NEGATIVE

## 2018-06-17 NOTE — Progress Notes (Signed)
Patient presents with pain under hallux left (sesmoiditis? Capsulitis?) ROM good; however pain upon palpation under proximal hallux.   F/O ordered per Dr March Rummage, plan on k-wedge w/ crepe under big toe.

## 2018-06-17 NOTE — Patient Instructions (Addendum)
     IF you received an x-ray today, you will receive an invoice from [facility]. Please contact [facility] at [phone] with questions or concerns regarding your invoice.   IF you received labwork today, you will receive an invoice from [facility]. Please contact [facility] at [phone] with questions or concerns regarding your invoice.   Our billing staff will not be able to assist you with questions regarding bills from these companies.  You will be contacted with the lab results as soon as they are available. The fastest way to get your results is to activate your My Chart account. Instructions are located on the last page of this paperwork. If you have not heard from us regarding the results in 2 weeks, please contact this office.     

## 2018-06-18 LAB — BASIC METABOLIC PANEL
BUN / CREAT RATIO: 13 (ref 9–23)
BUN: 9 mg/dL (ref 6–24)
CHLORIDE: 103 mmol/L (ref 96–106)
CO2: 24 mmol/L (ref 20–29)
Calcium: 9.3 mg/dL (ref 8.7–10.2)
Creatinine, Ser: 0.7 mg/dL (ref 0.57–1.00)
GFR calc non Af Amer: 104 mL/min/{1.73_m2} (ref >59)
GFR, EST AFRICAN AMERICAN: 120 mL/min/{1.73_m2} (ref >59)
GLUCOSE: 78 mg/dL (ref 65–99)
POTASSIUM: 3.8 mmol/L (ref 3.5–5.2)
Sodium: 139 mmol/L (ref 134–144)

## 2018-06-18 LAB — CBC
Hematocrit: 40.1 % (ref 34.0–46.6)
Hemoglobin: 13.7 g/dL (ref 11.1–15.9)
MCH: 30.4 pg (ref 26.6–33.0)
MCHC: 34.2 g/dL (ref 31.5–35.7)
MCV: 89 fL (ref 79–97)
PLATELETS: 333 10*3/uL (ref 150–450)
RBC: 4.51 x10E6/uL (ref 3.77–5.28)
RDW: 14.4 % (ref 12.3–15.4)
WBC: 7.3 10*3/uL (ref 3.4–10.8)

## 2018-06-18 LAB — PT AND PTT
INR: 1 (ref 0.8–1.2)
Prothrombin Time: 10.4 s (ref 9.1–12.0)
aPTT: 29 s (ref 24–33)

## 2018-06-18 LAB — HIV ANTIBODY (ROUTINE TESTING W REFLEX): HIV SCREEN 4TH GENERATION: NONREACTIVE

## 2018-06-20 NOTE — Progress Notes (Signed)
  Subjective:  Patient ID: Deborah Cordova, female    DOB: 10/20/1971,  MRN: [id number]  Chief Complaint  Patient presents with  . Nail Problem    follow up onychomycosis - better but not gone/ s/p left [place] bunionectomy - still having some pain   DOS: 01/13/17 Procedure: Left [place] bunionectomy  47 y.o. female returns for post-op check.  Still having some occasional pains in the bunion on the left foot mostly pleased with the results of surgery.  States that the onychomycosis is better but not completely gone  Objective:   General AA&O x3. Normal mood and affect.  Vascular Foot warm and well perfused.  Neurologic Gross sensation intact.  Dermatologic Skin well-healed with thin scar.  Nails with proximal clearing noted  Orthopedic: Left hallux rectus without pain to palpation no limitation of range of motion.    Assessment & Plan:  Patient was evaluated and treated and all questions answered.  S/p left Austin bunionectomy -We will fabricate orthotics for patient   Onychomycosis, improving. -Advised she needs to wait for the nails completely grow out to notice complete resolution   Return in about 8 weeks (around 08/12/2018) for orthotic check.

## 2018-07-07 MED FILL — XOLAIR 150 MG SOLR: 150 | 28 days supply | Qty: 2 | Fill #2

## 2018-07-07 MED FILL — WATER FOR INJECTION VIAL: 30 days supply | Qty: 10 | Fill #1

## 2018-07-07 MED FILL — raNITIdine HCL 150 MG TABS: 150 | 90 days supply | Qty: 180 | Fill #4

## 2018-07-07 MED FILL — AMLODIPINE BESYLATE 5 MG TA: 5 | 90 days supply | Qty: 90 | Fill #2

## 2018-07-08 ENCOUNTER — Ambulatory Visit: Payer: 59 | Admitting: Orthotics

## 2018-07-08 DIAGNOSIS — M21619 Bunion of unspecified foot: Secondary | ICD-10-CM

## 2018-07-08 NOTE — Progress Notes (Signed)
Patient came in today to p/up functional foot orthotics.   The orthotics were assessed to both fit and function.  The F/O addressed the biomechanical issues/pathologies as intended, offering good longitudinal arch support, proper offloading, and foot support.  However, patient wore sandals in and was advised of the need for appropriate footwear.   She will bring back if there is any need for trim or adjusting.

## 2018-07-19 ENCOUNTER — Encounter: Payer: Self-pay | Admitting: Physician Assistant

## 2018-07-19 ENCOUNTER — Telehealth: Payer: Self-pay

## 2018-07-19 ENCOUNTER — Other Ambulatory Visit: Payer: Self-pay

## 2018-07-19 ENCOUNTER — Ambulatory Visit (INDEPENDENT_AMBULATORY_CARE_PROVIDER_SITE_OTHER): Payer: 59 | Admitting: Physician Assistant

## 2018-07-19 ENCOUNTER — Ambulatory Visit (INDEPENDENT_AMBULATORY_CARE_PROVIDER_SITE_OTHER): Payer: 59

## 2018-07-19 VITALS — BP 130/82 | HR 79 | Temp 98.6°F | Resp 20 | Ht 68.74 in | Wt 209.6 lb

## 2018-07-19 DIAGNOSIS — R9431 Abnormal electrocardiogram [ECG] [EKG]: Secondary | ICD-10-CM | POA: Diagnosis not present

## 2018-07-19 DIAGNOSIS — J4550 Severe persistent asthma, uncomplicated: Secondary | ICD-10-CM

## 2018-07-19 DIAGNOSIS — J45909 Unspecified asthma, uncomplicated: Secondary | ICD-10-CM | POA: Diagnosis not present

## 2018-07-19 DIAGNOSIS — Z01818 Encounter for other preprocedural examination: Secondary | ICD-10-CM

## 2018-07-19 NOTE — Telephone Encounter (Signed)
[facility] fax # [phone] for medical records to be sent

## 2018-07-19 NOTE — Patient Instructions (Addendum)
We will fax your CXR results to the number provided. I have placed a referral to cardiology and they should contact you within a few weeks. Thank you for letting me participate in your health and well being.    IF you received an x-ray today, you will receive an invoice from [facility]. Please contact [facility] at [phone] with questions or concerns regarding your invoice.   IF you received labwork today, you will receive an invoice from [facility]. Please contact [facility] at [phone] with questions or concerns regarding your invoice.   Our billing staff will not be able to assist you with questions regarding bills from these companies.  You will be contacted with the lab results as soon as they are available. The fastest way to get your results is to activate your My Chart account. Instructions are located on the last page of this paperwork. If you have not heard from [place] regarding the results in 2 weeks, please contact this office.

## 2018-07-19 NOTE — Progress Notes (Signed)
Deborah Cordova  MRN: [id number] DOB: October 11, 1971  Subjective:  Deborah Cordova is a 47 y.o. female seen in office today for a chief complaint of need of CXR and repeat EKG. Pt has cosmetic sugery planned. Had preop clearance on 06/17/18 but due to EKG findings they wanted her to proceed with CXR before surgery, which is now scheduled for Sept 2019. Has PMH of asthma. She is on symbicort, singulair, and xolair. Feels very controlled. Has not had exacerbation requiring hospitalization in ~4 years. Has not seen pulmonologist in about 1 year. Has follow up with them scheduled for August 2019. Denies SOB, chest pain, DOE, palpitations, wheezing, chest tightness, lower leg swelling, and cough. In terms of abnormal EKG, notes she has seen cardiology in the past (~5 years ago) and had normal stress test and echo. Does not have those results with her. No PMH of HTN or heart disease. Denies smoking. Can exercise without any issues. Walks up at least 4 flights of stairs without having to stop. Last had surgery with general anesthesia 6 months ago and tolerated it well.  No other questions or concerns.   Review of Systems Per HPI   Patient Active Problem List   Diagnosis Date Noted  . Severe persistent asthma without complication   . Essential hypertension 10/01/2015  . Gastroesophageal reflux disease without esophagitis 10/01/2015    Current Outpatient Medications on File Prior to Visit  Medication Sig Dispense Refill  . acyclovir (ZOVIRAX) 400 MG tablet Take 400 mg by mouth as needed (For fever blisters.).    Marland Kitchen albuterol (ACCUNEB) 0.63 MG/3ML nebulizer solution Inhale into the lungs.    Marland Kitchen albuterol (PROAIR HFA) 108 (90 Base) MCG/ACT inhaler Inhale 2 puffs into the lungs every 4 (four) hours as needed for wheezing or shortness of breath. 3 Inhaler 0  . amLODipine (NORVASC) 5 MG tablet Take 1 tablet (5 mg total) by mouth daily. 90 tablet 3  . B-D 3CC LUER-LOK SYR 25GX1" 25G X 1" 3 ML MISC   1  .  budesonide-formoterol (SYMBICORT) 160-4.5 MCG/ACT inhaler Take 2 puffs first thing in am and then another 2 puffs about 12 hours later. 3 Inhaler 1  . Butalbital-APAP-Caffeine 50-300-40 MG CAPS Take by mouth as needed.    . cetirizine (ZYRTEC ALLERGY) 10 MG tablet Take 10 mg by mouth daily.    Marland Kitchen EPINEPHrine 0.3 mg/0.3 mL IJ SOAJ injection   0  . fluticasone (FLONASE) 50 MCG/ACT nasal spray Place 1 spray into both nostrils 2 (two) times daily. 16 g 6  . gabapentin (NEURONTIN) 300 MG capsule Take 1 capsule (300 mg total) by mouth at bedtime. 90 capsule 1  . ipratropium (ATROVENT) 0.03 % nasal spray Place into the nose.    . montelukast (SINGULAIR) 10 MG tablet Take 1 tablet (10 mg total) by mouth at bedtime. 90 tablet 3  . omalizumab (XOLAIR) 150 MG injection Inject 300 mg under the skin every 2 weeks. 4 vial 11  . ranitidine (ZANTAC) 150 MG tablet Take 1 tablet (150 mg total) by mouth 2 (two) times daily. 60 tablet 11  . triamcinolone ointment (KENALOG) 0.1 % Apply 1 application topically 2 (two) times daily. 30 g 0  . Water For Injection Sterile (STERILE WATER, PRESERVATIVE FREE,) injection   3   No current facility-administered medications on file prior to visit.     Allergies  Allergen Reactions  . Aspirin Other (See Comments) and Shortness Of Breath    Asthma flare up - wheezing  Asthma attack  . Nsaids Other (See Comments) and Shortness Of Breath    Asthma flare up - wheezing  Asthma attack  . Sulfamethoxazole-Trimethoprim Hives, Swelling, Rash and Other (See Comments)    Whelps and knots   . Tolmetin Shortness Of Breath    Asthma flare up - wheezing  . Cantaloupe (Diagnostic) Itching and Other (See Comments)    Makes tongue itch  . Other Other (See Comments)    Pecans and walnuts---induce asthma attack   . Pineapple Other (See Comments)    Tongue bleeds  . Kiwi Extract Itching and Other (See Comments)    Makes tongue itch Makes tongue itch  . Pecan Extract Allergy Skin Test  Itching    Pecans and walnuts---induce asthma attack   . Phenazopyridine Hcl Itching    Itching, swelling and burning Itching, swelling and burning     Objective:  BP 130/82 (BP Location: Left Arm, Patient Position: Sitting, Cuff Size: Large)   Pulse 79   Temp 98.6 F (37 C) (Oral)   Resp 20   Ht 5' 8.74" (1.746 m)   Wt 209 lb 9.6 oz (95.1 kg)   LMP 03/29/2016 (Approximate)   SpO2 98%   BMI 31.19 kg/m   Physical Exam  Constitutional: She is oriented to person, place, and time. She appears well-developed and well-nourished. No distress.  HENT:  Head: Normocephalic and atraumatic.  Mouth/Throat: Uvula is midline, oropharynx is clear and moist and mucous membranes are normal.  Eyes: Pupils are equal, round, and reactive to light. Conjunctivae and EOM are normal.  Neck: Normal range of motion.  Cardiovascular: Normal rate, regular rhythm, normal heart sounds and intact distal pulses.  Pulmonary/Chest: Effort normal and breath sounds normal. No respiratory distress. She has no wheezes. She has no rhonchi. She has no rales.  Musculoskeletal:       Right lower leg: She exhibits no swelling.       Left lower leg: She exhibits no swelling.  Neurological: She is alert and oriented to person, place, and time.  Skin: Skin is warm and dry.  Psychiatric: She has a normal mood and affect.  Vitals reviewed.   Dg Chest 2 View  Result Date: 07/19/2018 CLINICAL DATA:  Patient with asthma.  Preoperative evaluation. EXAM: CHEST - 2 VIEW COMPARISON:  Chest radiograph 12/21/2017. FINDINGS: The heart size and mediastinal contours are within normal limits. Both lungs are clear. The visualized skeletal structures are unremarkable. IMPRESSION: No active cardiopulmonary disease. Electronically Signed   By: Lovey Newcomer M.D.   On: 07/19/2018 09:41   EKG shows NSR with rate of 69 bpm. PR and QRS intervals within normal limits. Biphasic p waves noted in leads I, II, AVF, and V2-V6.  No ST or T wave  abnormalities. No acute changes noted from prior EKG of 05/2018. Findings presented and discussed with Dr. Tamala Julian.  Assessment and Plan :   1. Preoperative testing CXR with no active cardiopulmonary dz, which is reassuring. Asthma is well controlled at this time.Has follow up scheduled with pulmonology.  EKG does show biphasic p waves. Do not think this warrants cardiology work up before surgery. However, due to hx, will refer to cardiology to ensure no further work up, such as echo, is needed. - DG Chest 2 View; Future - EKG 12-Lead  2. Severe persistent asthma without complication - DG Chest 2 View; Future - EKG 12-Lead  3. Nonspecific abnormal electrocardiogram (ECG) (EKG) - Ambulatory referral to Cardiology   Tenna Delaine PA-C  [facility] [place] 07/19/2018 10:40 AM

## 2018-07-28 MED FILL — MONTELUKAST SOD 10 MG TAB: 10 | 90 days supply | Qty: 90 | Fill #1

## 2018-07-28 MED FILL — XOLAIR 150 MG SOLR: 150 | 28 days supply | Qty: 2 | Fill #3

## 2018-07-28 MED FILL — BD 3 ML SYRINGE 25GX1: 25G X 1" | 168 days supply | Qty: 12 | Fill #0

## 2018-07-28 MED FILL — BD 3 ML SYRINGE 25GX1": 25G X 1" | 168 days supply | Qty: 12 | Fill #0

## 2018-07-29 MED FILL — WATER FOR INJECTION VIAL: 30 days supply | Qty: 10 | Fill #2

## 2018-08-02 DIAGNOSIS — J45909 Unspecified asthma, uncomplicated: Secondary | ICD-10-CM | POA: Diagnosis not present

## 2018-08-02 DIAGNOSIS — I10 Essential (primary) hypertension: Secondary | ICD-10-CM | POA: Diagnosis not present

## 2018-08-02 DIAGNOSIS — J4540 Moderate persistent asthma, uncomplicated: Secondary | ICD-10-CM | POA: Diagnosis not present

## 2018-08-02 DIAGNOSIS — R9431 Abnormal electrocardiogram [ECG] [EKG]: Secondary | ICD-10-CM | POA: Diagnosis not present

## 2018-08-02 DIAGNOSIS — J449 Chronic obstructive pulmonary disease, unspecified: Secondary | ICD-10-CM | POA: Diagnosis not present

## 2018-08-02 DIAGNOSIS — Z01810 Encounter for preprocedural cardiovascular examination: Secondary | ICD-10-CM | POA: Diagnosis not present

## 2018-08-06 ENCOUNTER — Ambulatory Visit (INDEPENDENT_AMBULATORY_CARE_PROVIDER_SITE_OTHER): Payer: 59 | Admitting: Family Medicine

## 2018-08-06 ENCOUNTER — Encounter: Payer: Self-pay | Admitting: Family Medicine

## 2018-08-06 VITALS — BP 114/79 | HR 71 | Temp 98.1°F | Resp 17 | Ht 68.0 in | Wt 211.0 lb

## 2018-08-06 DIAGNOSIS — L299 Pruritus, unspecified: Secondary | ICD-10-CM | POA: Diagnosis not present

## 2018-08-06 MED ORDER — OLOPATADINE HCL 0.2 % OP SOLN: 1.0000 [drp] | Freq: Every day | OPHTHALMIC | 0 refills | Status: AC

## 2018-08-06 NOTE — Progress Notes (Signed)
Chief Complaint  Patient presents with  . ear itching/right ear pain    HPI  Itching in the right ear with some pain No runny nose Has environmental allergies and takes allergy shots Denies fevers or chills  Past Medical History:  Diagnosis Date  . Asthma   . COPD (chronic obstructive pulmonary disease) ([place])   . Fibroid   . Genital warts   . Hypertension   . PID (pelvic inflammatory disease)   . Renal disorder     Current Outpatient Medications  Medication Sig Dispense Refill  . acyclovir (ZOVIRAX) 400 MG tablet Take 400 mg by mouth as needed (For fever blisters.).    Marland Kitchen albuterol (ACCUNEB) 0.63 MG/3ML nebulizer solution Inhale into the lungs.    Marland Kitchen albuterol (PROAIR HFA) 108 (90 Base) MCG/ACT inhaler Inhale 2 puffs into the lungs every 4 (four) hours as needed for wheezing or shortness of breath. 3 Inhaler 0  . amLODipine (NORVASC) 5 MG tablet Take 1 tablet (5 mg total) by mouth daily. 90 tablet 3  . B-D 3CC LUER-LOK SYR 25GX1" 25G X 1" 3 ML MISC   1  . budesonide-formoterol (SYMBICORT) 160-4.5 MCG/ACT inhaler Take 2 puffs first thing in am and then another 2 puffs about 12 hours later. 3 Inhaler 1  . Butalbital-APAP-Caffeine 50-300-40 MG CAPS Take by mouth as needed.    . cetirizine (ZYRTEC ALLERGY) 10 MG tablet Take 10 mg by mouth daily.    Marland Kitchen EPINEPHrine 0.3 mg/0.3 mL IJ SOAJ injection   0  . fluticasone (FLONASE) 50 MCG/ACT nasal spray Place 1 spray into both nostrils 2 (two) times daily. 16 g 6  . gabapentin (NEURONTIN) 300 MG capsule Take 1 capsule (300 mg total) by mouth at bedtime. 90 capsule 1  . ipratropium (ATROVENT) 0.03 % nasal spray Place into the nose.    . montelukast (SINGULAIR) 10 MG tablet Take 1 tablet (10 mg total) by mouth at bedtime. 90 tablet 3  . omalizumab (XOLAIR) 150 MG injection Inject 300 mg under the skin every 2 weeks. 4 vial 11  . ranitidine (ZANTAC) 150 MG tablet Take 1 tablet (150 mg total) by mouth 2 (two) times daily. 60 tablet 11  .  triamcinolone ointment (KENALOG) 0.1 % Apply 1 application topically 2 (two) times daily. 30 g 0  . Water For Injection Sterile (STERILE WATER, PRESERVATIVE FREE,) injection   3  . Olopatadine HCl 0.2 % SOLN Apply 1 drop to eye daily. 2.5 mL 0   No current facility-administered medications for this visit.     Allergies:  Allergies  Allergen Reactions  . Aspirin Other (See Comments) and Shortness Of Breath    Asthma flare up - wheezing Asthma attack  . Nsaids Other (See Comments) and Shortness Of Breath    Asthma flare up - wheezing  Asthma attack  . Sulfamethoxazole-Trimethoprim Hives, Swelling, Rash and Other (See Comments)    Whelps and knots   . Tolmetin Shortness Of Breath    Asthma flare up - wheezing  . Cantaloupe (Diagnostic) Itching and Other (See Comments)    Makes tongue itch  . Other Other (See Comments)    Pecans and walnuts---induce asthma attack   . Pineapple Other (See Comments)    Tongue bleeds  . Kiwi Extract Itching and Other (See Comments)    Makes tongue itch Makes tongue itch  . Pecan Extract Allergy Skin Test Itching    Pecans and walnuts---induce asthma attack   . Phenazopyridine Hcl Itching  Itching, swelling and burning Itching, swelling and burning    Past Surgical History:  Procedure Laterality Date  . COLPOSCOPY    . DILATION AND CURETTAGE OF UTERUS    . LAPAROSCOPIC TOTAL HYSTERECTOMY    . laser genital warts      Social History   Socioeconomic History  . Marital status: Married    Spouse name: Not on file  . Number of children: 4  . Years of education: Not on file  . Highest education level: Not on file  Occupational History  . Not on file  Social Needs  . Financial resource strain: Not on file  . Food insecurity:    Worry: Not on file    Inability: Not on file  . Transportation needs:    Medical: Not on file    Non-medical: Not on file  Tobacco Use  . Smoking status: Never Smoker  . Smokeless tobacco: Never Used    Substance and Sexual Activity  . Alcohol use: Yes    Alcohol/week: 1.0 - 3.0 standard drinks    Types: 1 - 3 Glasses of wine per week  . Drug use: No  . Sexual activity: Yes    Partners: Male    Birth control/protection: Surgical    Comment: hysterectomy  Lifestyle  . Physical activity:    Days per week: Not on file    Minutes per session: Not on file  . Stress: Not on file  Relationships  . Social connections:    Talks on phone: Not on file    Gets together: Not on file    Attends religious service: Not on file    Active member of club or organization: Not on file    Attends meetings of clubs or organizations: Not on file    Relationship status: Not on file  Other Topics Concern  . Not on file  Social History Narrative  . Not on file    Family History  Problem Relation Age of Onset  . Depression Mother   . Diabetes Mother   . Hepatitis C Mother   . Hypertension Mother   . Asthma Mother   . COPD Father   . Emphysema Father        smoked  . Hepatitis C Father   . Asthma Sister   . Breast cancer Sister   . Asthma Brother   . Diabetes Brother   . Hyperlipidemia Brother   . Stroke Maternal Grandmother   . Cancer Maternal Grandfather   . Cancer Paternal Grandmother   . Cancer Paternal Grandfather      ROS Review of Systems See HPI Constitution: No fevers or chills No malaise No diaphoresis Skin: No rash or itching Eyes: no blurry vision, no double vision Neuro: no dizziness or headaches all others reviewed and negative   Objective: Vitals:   08/06/18 1706  BP: 114/79  Pulse: 71  Resp: 17  Temp: 98.1 F (36.7 C)  TempSrc: Oral  SpO2: 98%  Weight: 211 lb (95.7 kg)  Height: 5\' 8"  (1.727 m)    Physical Exam  Constitutional: She appears well-developed and well-nourished.  HENT:  Head: Normocephalic and atraumatic.  Pulmonary/Chest: Effort normal.    Ear pinna on the right with inflammatory hyperpigmentation Left ear normal TM clear  bilaterally   Assessment and Plan Deborah Cordova was seen today for ear itching/right ear pain.  Diagnoses and all orders for this visit:  Itching of ear - likely rhinitis  Other orders -  Olopatadine HCl 0.2 % SOLN; Apply 1 drop to ear daily.     [place]

## 2018-08-06 NOTE — Patient Instructions (Addendum)
   IF you received an x-ray today, you will receive an invoice from [facility]. Please contact [facility] at [phone] with questions or concerns regarding your invoice.   IF you received labwork today, you will receive an invoice from [facility]. Please contact [facility] at [phone] with questions or concerns regarding your invoice.   Our billing staff will not be able to assist you with questions regarding bills from these companies.  You will be contacted with the lab results as soon as they are available. The fastest way to get your results is to activate your My Chart account. Instructions are located on the last page of this paperwork. If you have not heard from us regarding the results in 2 weeks, please contact this office.    Allergic Rhinitis, Adult Allergic rhinitis is an allergic reaction that affects the mucous membrane inside the nose. It causes sneezing, a runny or stuffy nose, and the feeling of mucus going down the back of the throat (postnasal drip). Allergic rhinitis can be mild to severe. There are two types of allergic rhinitis:  Seasonal. This type is also called hay fever. It happens only during certain seasons.  Perennial. This type can happen at any time of the year.  What are the causes? This condition happens when the body's defense system (immune system) responds to certain harmless substances called allergens as though they were germs.  Seasonal allergic rhinitis is triggered by pollen, which can come from grasses, trees, and weeds. Perennial allergic rhinitis may be caused by:  House dust mites.  Pet dander.  Mold spores.  What are the signs or symptoms? Symptoms of this condition include:  Sneezing.  Runny or stuffy nose (nasal congestion).  Postnasal drip.  Itchy nose.  Tearing of the eyes.  Trouble sleeping.  Daytime sleepiness.  How is this diagnosed? This condition may be diagnosed based on:  Your medical  history.  A physical exam.  Tests to check for related conditions, such as: ? Asthma. ? Pink eye. ? Ear infection. ? Upper respiratory infection.  Tests to find out which allergens trigger your symptoms. These may include skin or blood tests.  How is this treated? There is no cure for this condition, but treatment can help control symptoms. Treatment may include:  Taking medicines that block allergy symptoms, such as antihistamines. Medicine may be given as a shot, nasal spray, or pill.  Avoiding the allergen.  Desensitization. This treatment involves getting ongoing shots until your body becomes less sensitive to the allergen. This treatment may be done if other treatments do not help.  If taking medicine and avoiding the allergen does not work, new, stronger medicines may be prescribed.  Follow these instructions at home:  Find out what you are allergic to. Common allergens include smoke, dust, and pollen.  Avoid the things you are allergic to. These are some things you can do to help avoid allergens: ? Replace carpet with wood, tile, or vinyl flooring. Carpet can trap dander and dust. ? Do not smoke. Do not allow smoking in your home. ? Change your heating and air conditioning filter at least once a month. ? During allergy season:  Keep windows closed as much as possible.  Plan outdoor activities when pollen counts are lowest. This is usually during the evening hours.  When coming indoors, change clothing and shower before sitting on furniture or bedding.  Take over-the-counter and prescription medicines only as told by your health care provider.  Keep all   follow-up visits as told by your health care provider. This is important. Contact a health care provider if:  You have a fever.  You develop a persistent cough.  You make whistling sounds when you breathe (you wheeze).  Your symptoms interfere with your normal daily activities. Get help right away if:  You  have shortness of breath. Summary  This condition can be managed by taking medicines as directed and avoiding allergens.  Contact your health care provider if you develop a persistent cough or fever.  During allergy season, keep windows closed as much as possible. This information is not intended to replace advice given to you by your health care provider. Make sure you discuss any questions you have with your health care provider. Document Released: 09/09/2001 Document Revised: 01/22/2017 Document Reviewed: 01/22/2017 Elsevier Interactive Patient Education  2018 [employer]vier Inc.  

## 2018-08-09 ENCOUNTER — Other Ambulatory Visit: Payer: Self-pay | Admitting: *Deleted

## 2018-08-12 ENCOUNTER — Ambulatory Visit (INDEPENDENT_AMBULATORY_CARE_PROVIDER_SITE_OTHER): Payer: 59 | Admitting: Podiatry

## 2018-08-12 DIAGNOSIS — M792 Neuralgia and neuritis, unspecified: Secondary | ICD-10-CM | POA: Diagnosis not present

## 2018-08-12 DIAGNOSIS — B351 Tinea unguium: Secondary | ICD-10-CM | POA: Diagnosis not present

## 2018-08-12 DIAGNOSIS — M779 Enthesopathy, unspecified: Secondary | ICD-10-CM

## 2018-08-12 NOTE — Progress Notes (Signed)
  Subjective:  Patient ID: Deborah Cordova, female    DOB: [birth year],  MRN: [id number]  No chief complaint on file.  DOS: 01/13/17 Procedure: Left Austin bunionectomy  47 y.o. female returns for post-op check. Pleased with orthotics. Only has occasional stiffness of her toe. Overall pleased with surgery. No complaints today.   Nails improving per patient but had recent pedicure and does not wish to have polish removed.  Objective:   General AA&O x3. Normal mood and affect.  Vascular Foot warm and well perfused.  Neurologic Gross sensation intact. Slight area along incision of decreased sensation.  Dermatologic Skin well-healed with thin scar.   Unable to eval nails due to polish.  Orthopedic: Left hallux rectus without pain to palpation no limitation of range of motion.    Assessment & Plan:  Patient was evaluated and treated and all questions answered.  S/p left Austin bunionectomy -Orthotics helping significantly. Advised for f/u for further pairs as needed. -No pain today.  Onychomycosis, improving. -UTA but improving per patient. Await nail growth for complete resolution.   No follow-ups on file.

## 2018-08-13 ENCOUNTER — Telehealth: Payer: Self-pay | Admitting: Family Medicine

## 2018-08-13 DIAGNOSIS — Z01818 Encounter for other preprocedural examination: Secondary | ICD-10-CM

## 2018-08-13 NOTE — Telephone Encounter (Signed)
Lab Results  Component Value Date   WBC 7.3 06/17/2018   HGB 13.7 06/17/2018   HCT 40.1 06/17/2018   MCV 89 06/17/2018   PLT 333 06/17/2018   Repeat cbc ordered for preop testing

## 2018-08-16 ENCOUNTER — Ambulatory Visit (INDEPENDENT_AMBULATORY_CARE_PROVIDER_SITE_OTHER): Payer: 59 | Admitting: Physician Assistant

## 2018-08-16 ENCOUNTER — Other Ambulatory Visit: Payer: Self-pay

## 2018-08-16 ENCOUNTER — Encounter: Payer: Self-pay | Admitting: Physician Assistant

## 2018-08-16 ENCOUNTER — Ambulatory Visit (INDEPENDENT_AMBULATORY_CARE_PROVIDER_SITE_OTHER): Payer: 59

## 2018-08-16 VITALS — BP 135/81 | HR 96 | Temp 98.1°F | Ht 68.0 in | Wt 210.8 lb

## 2018-08-16 DIAGNOSIS — G8929 Other chronic pain: Secondary | ICD-10-CM

## 2018-08-16 DIAGNOSIS — M25551 Pain in right hip: Secondary | ICD-10-CM | POA: Diagnosis not present

## 2018-08-16 DIAGNOSIS — M25552 Pain in left hip: Secondary | ICD-10-CM | POA: Diagnosis not present

## 2018-08-16 DIAGNOSIS — M545 Low back pain, unspecified: Secondary | ICD-10-CM

## 2018-08-16 MED ORDER — CYCLOBENZAPRINE HCL 10 MG PO TABS: 10.0000 mg | ORAL_TABLET | Freq: Three times a day (TID) | ORAL | 0 refills | Status: AC | PRN

## 2018-08-16 NOTE — Patient Instructions (Addendum)
Apply moist heat to the area. Wet a towel and wring it out so it is damp. Put it in the microwave for about 15-20 seconds - long enough to make it hot, but not too hot to apply to your skin causing burns. Do this for about 20-30 minutes, 3-4 times a day.   Perform gentle, light stretches 2-3 times a day.   Put a tennis ball between your back and a wall. Gentle massage the area with rolling the ball around the affected area. Try a foam roller.   Stay well hydrated - try to drink 32-64 oz/day.   If you feel like you need a new pillow, try "My Pillow".  Come back and see me if you would like to try a dry needling session. (see below).    Trigger Point Dry Needling   What is Trigger Point Dry Needling (DN)?   1. DN is a physical therapy technique used to treat muscle pain and Dysfunction.  Specifically, DN helps deactivate muscle trigger points (Muscle Knots).   2. A thin filiform needle is used to penetrate the skin and stimulate the underlying trigger point.  The goal is for a local twitch response (LTR) to occur and for the trigger point to relax.  No medication of any kind is injected during the procedure.   What Does Trigger Point Dry Needling Feel Like?   1. The procedures feels different for each individual patient.   Some patients report that they do not actually feel the needle enter the skin and overall the process is not painful.  Very  mild bleeding may occur.  However, many patients feel a deep cramping in the muscle in which the needle was inserted. This is the local twitch response.    How Will I Feel After The Treatment?   1. Soreness is normal, and the onset of soreness may not occur for a few hours.  Typically this soreness does not last longer than two days.   2. Bruising is uncommon, however; ice can be used to decrease any possible bruising.   3. In rare cases feeling tired or nauseous after the treatment is normal.  In addition, your symptoms may get worse before they  get better, this period will typically not last longer than 24 hours.   What Can I do After My Treatment?   1.  Increase your hydration by drinking more water for the next 24 hours.   2.  You may place ice or heat on the areas treated that have become sore, however don not use heat on inflamed or bruised areas.  Heat often brings more relief post needling.   3. You can continue your regular activities, but vigorous activity is not recommended initially after the treatment for 24 hours.   4. DN is best combined with other physical therapy such as strengthening, stretching, and other therapies.    Thank you for coming in today. I hope you feel we met your needs.  Feel free to call PCP if you have any questions or further requests.  Please consider signing up for MyChart if you do not already have it, as this is a great way to communicate with me.  Best,  Whitney McVey, PA-C   IF you received an x-ray today, you will receive an invoice from [facility]. Please contact [facility] at [phone] with questions or concerns regarding your invoice.   IF you received labwork today, you will receive an invoice from [place]. Please contact  [facility] at [phone] with questions or concerns regarding your invoice.   Our billing staff will not be able to assist you with questions regarding bills from these companies.  You will be contacted with the lab results as soon as they are available. The fastest way to get your results is to activate your My Chart account. Instructions are located on the last page of this paperwork. If you have not heard from [place] regarding the results in 2 weeks, please contact this office.       IF you received an x-ray today, you will receive an invoice from [facility]. Please contact [facility] at [phone] with questions or concerns regarding your invoice.   IF you received labwork today, you will receive an invoice from  [place]. Please contact [facility] at [phone] with questions or concerns regarding your invoice.   Our billing staff will not be able to assist you with questions regarding bills from these companies.  You will be contacted with the lab results as soon as they are available. The fastest way to get your results is to activate your My Chart account. Instructions are located on the last page of this paperwork. If you have not heard from [place] regarding the results in 2 weeks, please contact this office.

## 2018-08-16 NOTE — Progress Notes (Signed)
Deborah Cordova  MRN: [id number] DOB: 01-10-1971  PCP: Forrest Moron, MD  Subjective:  [name] who presents to clinic for right hip pain.  This has been a problem for pt x several months, however has been worsening. Pain originates from right lower back and radiates to right hip and upper outer right thigh.  Denies muscle weakness, reduced ROM, n/t, saddle paresthesia, urinary symptoms, bowel or bladder incontenance.   She has been taking Tylenol arthritis, which is not helping.   Review of Systems  Gastrointestinal: Negative for abdominal pain, constipation and diarrhea.  Genitourinary: Negative for difficulty urinating, dysuria, flank pain and urgency.  Musculoskeletal: Positive for arthralgias (right hip) and back pain. Negative for gait problem.  Skin: Negative.   Neurological: Negative for dizziness, weakness, numbness and headaches.   Patient Active Problem List   Diagnosis Date Noted  . Severe persistent asthma without complication   . Essential hypertension 10/01/2015  . Gastroesophageal reflux disease without esophagitis 10/01/2015    Current Outpatient Medications on File Prior to Visit  Medication Sig Dispense Refill  . acyclovir (ZOVIRAX) 400 MG tablet Take 400 mg by mouth as needed (For fever blisters.).    Marland Kitchen albuterol (ACCUNEB) 0.63 MG/3ML nebulizer solution Inhale into the lungs.    Marland Kitchen albuterol (PROAIR HFA) 108 (90 Base) MCG/ACT inhaler Inhale 2 puffs into the lungs every 4 (four) hours as needed for wheezing or shortness of breath. 3 Inhaler 0  . amLODipine (NORVASC) 5 MG tablet Take 1 tablet (5 mg total) by mouth daily. 90 tablet 3  . B-D 3CC LUER-LOK SYR 25GX1" 25G X 1" 3 ML MISC   1  . budesonide-formoterol (SYMBICORT) 160-4.5 MCG/ACT inhaler Take 2 puffs first thing in am and then another 2 puffs about 12 hours later. 3 Inhaler 1  . Butalbital-APAP-Caffeine 50-300-40 MG CAPS Take by mouth as needed.    . cetirizine (ZYRTEC ALLERGY) 10 MG  tablet Take 10 mg by mouth daily.    Marland Kitchen EPINEPHrine 0.3 mg/0.3 mL IJ SOAJ injection   0  . fluticasone (FLONASE) 50 MCG/ACT nasal spray Place 1 spray into both nostrils 2 (two) times daily. 16 g 6  . gabapentin (NEURONTIN) 300 MG capsule Take 1 capsule (300 mg total) by mouth at bedtime. 90 capsule 1  . ipratropium (ATROVENT) 0.03 % nasal spray Place into the nose.    . montelukast (SINGULAIR) 10 MG tablet Take 1 tablet (10 mg total) by mouth at bedtime. 90 tablet 3  . Olopatadine HCl 0.2 % SOLN Apply 1 drop to eye daily. 2.5 mL 0  . omalizumab (XOLAIR) 150 MG injection Inject 300 mg under the skin every 2 weeks. 4 vial 11  . ranitidine (ZANTAC) 150 MG tablet Take 1 tablet (150 mg total) by mouth 2 (two) times daily. 60 tablet 11  . triamcinolone ointment (KENALOG) 0.1 % Apply 1 application topically 2 (two) times daily. 30 g 0  . Water For Injection Sterile (STERILE WATER, PRESERVATIVE FREE,) injection   3   No current facility-administered medications on file prior to visit.     Allergies  Allergen Reactions  . Aspirin Other (See Comments) and Shortness Of Breath    Asthma flare up - wheezing Asthma attack  . Nsaids Other (See Comments) and Shortness Of Breath    Asthma flare up - wheezing  Asthma attack  . Sulfamethoxazole-Trimethoprim Hives, Swelling, Rash and Other (See Comments)    Whelps and knots   .  Tolmetin Shortness Of Breath    Asthma flare up - wheezing  . Cantaloupe (Diagnostic) Itching and Other (See Comments)    Makes tongue itch  . Other Other (See Comments)    Pecans and walnuts---induce asthma attack   . Pineapple Other (See Comments)    Tongue bleeds  . Kiwi Extract Itching and Other (See Comments)    Makes tongue itch Makes tongue itch  . Pecan Extract Allergy Skin Test Itching    Pecans and walnuts---induce asthma attack   . Phenazopyridine Hcl Itching    Itching, swelling and burning Itching, swelling and burning     Objective:  BP 135/81 (BP  Location: Left Arm, Patient Position: Sitting, Cuff Size: Normal)   Pulse 96   Temp 98.1 F (36.7 C) (Oral)   Ht 5\' 8"  (1.727 m)   Wt 210 lb 12.8 oz (95.6 kg)   LMP 03/29/2016 (Approximate)   SpO2 96%   BMI 32.05 kg/m   Physical Exam  Constitutional: She is oriented to person, place, and time. No distress.  Musculoskeletal:       Right hip: She exhibits tenderness. She exhibits normal range of motion, normal strength, no bony tenderness, no crepitus and no deformity.       Lumbar back: She exhibits tenderness. She exhibits normal range of motion and no bony tenderness.       Back:  Neurological: She is alert and oriented to person, place, and time.  Skin: Skin is warm and dry.  Psychiatric: Judgment normal.  Vitals reviewed.  Dg Hip Unilat W Or W/o Pelvis 2-3 Views Right  Result Date: 08/16/2018 CLINICAL DATA:  Worsening left hip pain for the past several months. EXAM: DG HIP (WITH OR WITHOUT PELVIS) 2-3V RIGHT COMPARISON:  Coronal and sagittal reconstructed images through the pelvis and left hip from a scan of October 13, 2017 FINDINGS: The bony pelvis is subjectively adequately mineralized. There is no lytic nor blastic lesion. AP and lateral views of the right hip reveal reasonable preservation of the joint space. The articular surfaces of the right femoral head and acetabulum remains smoothly rounded. The right femoral neck, intertrochanteric, and subtrochanteric regions are normal. IMPRESSION: There is no acute or significant chronic bony abnormality of the right hip. Electronically Signed   By: David  Martinique M.D.   On: 08/16/2018 11:43   Assessment and Plan :  1. Chronic right-sided low back pain without sciatica 2. Right hip pain - Pt presents with worsening right hip pain. X-ray is negative. No red flags. Suspect muscle tightness of low back. Advised heat, massage, hydration. Will add muscle relaxer. RTC for dry needling if needed.  - DG HIP UNILAT W OR W/O PELVIS 2-3 VIEWS  RIGHT; Future - cyclobenzaprine (FLEXERIL) 10 MG tablet; Take 1 tablet (10 mg total) by mouth 3 (three) times daily as needed for muscle spasms.  Dispense: 30 tablet; Refill: 0   Mercer Pod, PA-C  [facility] 08/16/2018 10:54 AM  Please note: Portions of this report may have been transcribed using dragon voice recognition software. Every effort was made to ensure accuracy; however, inadvertent computerized transcription errors may be present.

## 2018-08-17 ENCOUNTER — Telehealth: Payer: Self-pay | Admitting: Physician Assistant

## 2018-08-17 NOTE — Telephone Encounter (Signed)
Contacted pharmacy regarding prescription refill request for cyclobenzaprine; spoke with Benard Halsted, Pharmacy Technician and he states that prescription has already been filled.

## 2018-08-18 ENCOUNTER — Other Ambulatory Visit: Payer: Self-pay | Admitting: Physician Assistant

## 2018-08-18 DIAGNOSIS — D649 Anemia, unspecified: Secondary | ICD-10-CM

## 2018-08-18 MED FILL — EPINEPHRINE 0.3 MG AUTO-INJ: 0.3 | 30 days supply | Qty: 2 | Fill #0

## 2018-08-18 MED FILL — XOLAIR 150 MG SOLR: 150 | 28 days supply | Qty: 2 | Fill #4

## 2018-08-18 MED FILL — WATER FOR INJECTION VIAL: 30 days supply | Qty: 10 | Fill #3

## 2018-08-18 MED FILL — VENTOLIN HFA 90 MCG INHALER: 108 (90 BAS | 75 days supply | Qty: 54 | Fill #3 | Status: TO

## 2018-08-18 NOTE — Progress Notes (Signed)
Orders Placed This Encounter  Procedures  . CBC with Differential/Platelet    Standing Status:   Future    Standing Expiration Date:   09/15/2018

## 2018-08-20 ENCOUNTER — Ambulatory Visit (INDEPENDENT_AMBULATORY_CARE_PROVIDER_SITE_OTHER): Payer: 59

## 2018-08-20 DIAGNOSIS — D649 Anemia, unspecified: Secondary | ICD-10-CM

## 2018-08-21 LAB — CBC WITH DIFFERENTIAL/PLATELET
BASOS ABS: 0.1 10*3/uL (ref 0.0–0.2)
Basos: 1 %
EOS (ABSOLUTE): 0.2 10*3/uL (ref 0.0–0.4)
Eos: 2 %
HEMATOCRIT: 40.5 % (ref 34.0–46.6)
HEMOGLOBIN: 13.7 g/dL (ref 11.1–15.9)
Immature Grans (Abs): 0 10*3/uL (ref 0.0–0.1)
Immature Granulocytes: 0 %
LYMPHS ABS: 4.7 10*3/uL — AB (ref 0.7–3.1)
Lymphs: 44 %
MCH: 30.2 pg (ref 26.6–33.0)
MCHC: 33.8 g/dL (ref 31.5–35.7)
MCV: 89 fL (ref 79–97)
MONOCYTES: 5 %
MONOS ABS: 0.6 10*3/uL (ref 0.1–0.9)
Neutrophils Absolute: 5.1 10*3/uL (ref 1.4–7.0)
Neutrophils: 48 %
Platelets: 344 10*3/uL (ref 150–450)
RBC: 4.54 x10E6/uL (ref 3.77–5.28)
RDW: 13.8 % (ref 12.3–15.4)
WBC: 10.6 10*3/uL (ref 3.4–10.8)

## 2018-09-06 ENCOUNTER — Encounter: Payer: Self-pay | Admitting: Physician Assistant

## 2018-09-06 ENCOUNTER — Telehealth: Payer: Self-pay

## 2018-09-06 ENCOUNTER — Other Ambulatory Visit: Payer: Self-pay

## 2018-09-06 ENCOUNTER — Ambulatory Visit (INDEPENDENT_AMBULATORY_CARE_PROVIDER_SITE_OTHER): Payer: Self-pay

## 2018-09-06 ENCOUNTER — Ambulatory Visit (INDEPENDENT_AMBULATORY_CARE_PROVIDER_SITE_OTHER): Payer: Self-pay | Admitting: Physician Assistant

## 2018-09-06 ENCOUNTER — Ambulatory Visit: Payer: 59

## 2018-09-06 VITALS — BP 114/82 | HR 86 | Temp 98.5°F | Resp 18 | Ht 68.75 in | Wt 208.4 lb

## 2018-09-06 DIAGNOSIS — Z01818 Encounter for other preprocedural examination: Secondary | ICD-10-CM

## 2018-09-06 DIAGNOSIS — J4550 Severe persistent asthma, uncomplicated: Secondary | ICD-10-CM

## 2018-09-06 DIAGNOSIS — G2581 Restless legs syndrome: Secondary | ICD-10-CM

## 2018-09-06 NOTE — Telephone Encounter (Signed)
Patient presented for office visit.

## 2018-09-06 NOTE — Telephone Encounter (Signed)
Pt called to ask provider to place labs for surgical clearance.   UA Cmet Bmp HCG PT,PTP INR

## 2018-09-06 NOTE — Progress Notes (Signed)
Deborah Cordova  MRN: [id number] DOB: 11/11/1971  PCP: Forrest Moron, MD  Chief Complaint  Patient presents with  . surgical clearance    Subjective:  Pt presents to clinic for per-operative clearance for cosmetic surgery that requires general anesthesia. She had surgical clearance on 7/22 but she is outside of the date that they will not accept those results.  Hospitalized for asthma- about 4 years ago - she takes her daily medications for her asthma and currently it is well controlled. - she has gotten clearance from pulmonology.  No smoking.  Exercises without problems.  No trouble walking up a flight of steps when her asthma is controlled.  HTN - well controlled on medications - had cardiology clearance in July due to abnl EKG in the office in June  Problems with anesthesia in the past - none - last surgery was 8 months ago with general anesthesia and she had no problems.  History is obtained by patient.  Review of Systems  Constitutional: Negative for chills and fever.  Eyes: Negative for visual disturbance.  Respiratory: Negative for cough and shortness of breath.   Cardiovascular: Negative for chest pain, palpitations and leg swelling.  Neurological: Negative for dizziness, light-headedness and headaches.    Patient Active Problem List   Diagnosis Date Noted  . Restless leg 09/06/2018  . Severe persistent asthma without complication   . Essential hypertension 10/01/2015  . Gastroesophageal reflux disease without esophagitis 10/01/2015    Current Outpatient Medications on File Prior to Visit  Medication Sig Dispense Refill  . acyclovir (ZOVIRAX) 400 MG tablet Take 400 mg by mouth as needed (For fever blisters.).    Marland Kitchen albuterol (ACCUNEB) 0.63 MG/3ML nebulizer solution Inhale into the lungs.    Marland Kitchen albuterol (PROAIR HFA) 108 (90 Base) MCG/ACT inhaler Inhale 2 puffs into the lungs every 4 (four) hours as needed for wheezing or shortness of breath. 3 Inhaler 0  .  amLODipine (NORVASC) 5 MG tablet Take 1 tablet (5 mg total) by mouth daily. 90 tablet 3  . B-D 3CC LUER-LOK SYR 25GX1" 25G X 1" 3 ML MISC   1  . budesonide-formoterol (SYMBICORT) 160-4.5 MCG/ACT inhaler Take 2 puffs first thing in am and then another 2 puffs about 12 hours later. 3 Inhaler 1  . Butalbital-APAP-Caffeine 50-300-40 MG CAPS Take by mouth as needed.    . cetirizine (ZYRTEC ALLERGY) 10 MG tablet Take 10 mg by mouth daily.    . cyclobenzaprine (FLEXERIL) 10 MG tablet Take 1 tablet (10 mg total) by mouth 3 (three) times daily as needed for muscle spasms. 30 tablet 0  . EPINEPHrine 0.3 mg/0.3 mL IJ SOAJ injection   0  . fluticasone (FLONASE) 50 MCG/ACT nasal spray Place 1 spray into both nostrils 2 (two) times daily. 16 g 6  . gabapentin (NEURONTIN) 300 MG capsule Take 1 capsule (300 mg total) by mouth at bedtime. 90 capsule 1  . ipratropium (ATROVENT) 0.03 % nasal spray Place into the nose.    . montelukast (SINGULAIR) 10 MG tablet Take 1 tablet (10 mg total) by mouth at bedtime. 90 tablet 3  . Olopatadine HCl 0.2 % SOLN Apply 1 drop to eye daily. 2.5 mL 0  . omalizumab (XOLAIR) 150 MG injection Inject 300 mg under the skin every 2 weeks. 4 vial 11  . ranitidine (ZANTAC) 150 MG tablet Take 1 tablet (150 mg total) by mouth 2 (two) times daily. 60 tablet 11  . triamcinolone ointment (KENALOG) 0.1 % Apply  1 application topically 2 (two) times daily. 30 g 0  . Water For Injection Sterile (STERILE WATER, PRESERVATIVE FREE,) injection   3   No current facility-administered medications on file prior to visit.     Allergies  Allergen Reactions  . Aspirin Other (See Comments) and Shortness Of Breath    Asthma flare up - wheezing Asthma attack  . Nsaids Other (See Comments) and Shortness Of Breath    Asthma flare up - wheezing  Asthma attack  . Sulfamethoxazole-Trimethoprim Hives, Swelling, Rash and Other (See Comments)    Whelps and knots   . Tolmetin Shortness Of Breath    Asthma  flare up - wheezing  . Cantaloupe (Diagnostic) Itching and Other (See Comments)    Makes tongue itch  . Other Other (See Comments)    Pecans and walnuts---induce asthma attack   . Pineapple Other (See Comments)    Tongue bleeds  . Kiwi Extract Itching and Other (See Comments)    Makes tongue itch Makes tongue itch  . Pecan Extract Allergy Skin Test Itching    Pecans and walnuts---induce asthma attack   . Phenazopyridine Hcl Itching    Itching, swelling and burning Itching, swelling and burning    Past Medical History:  Diagnosis Date  . Asthma   . COPD (chronic obstructive pulmonary disease) ([place])   . Fibroid   . Genital warts   . Hypertension   . PID (pelvic inflammatory disease)   . Renal disorder    Social History   Social History Narrative  . Not on file   Social History   Tobacco Use  . Smoking status: Never Smoker  . Smokeless tobacco: Never Used  Substance Use Topics  . Alcohol use: Yes    Alcohol/week: 1.0 - 3.0 standard drinks    Types: 1 - 3 Glasses of wine per week  . Drug use: No   family history includes Asthma in her brother, mother, and sister; Breast cancer in her sister; COPD in her father; Cancer in her maternal grandfather, paternal grandfather, and paternal grandmother; Depression in her mother; Diabetes in her brother and mother; Emphysema in her father; Hepatitis C in her father and mother; Hyperlipidemia in her brother; Hypertension in her mother; Stroke in her maternal grandmother.     Objective:  BP 114/82   Pulse 86   Temp 98.5 F (36.9 C) (Oral)   Resp 18   Ht 5' 8.75" (1.746 m)   Wt 208 lb 6.4 oz (94.5 kg)   LMP 03/29/2016 (Approximate)   SpO2 99%   BMI 31.00 kg/m  Body mass index is 31 kg/m.  Wt Readings from Last 3 Encounters:  09/06/18 208 lb 6.4 oz (94.5 kg)  08/16/18 210 lb 12.8 oz (95.6 kg)  08/06/18 211 lb (95.7 kg)    Physical Exam  Constitutional: She is oriented to person, place, and time. She appears  well-developed and well-nourished.  HENT:  Head: Normocephalic and atraumatic.  Right Ear: Hearing and external ear normal.  Left Ear: Hearing and external ear normal.  Eyes: Conjunctivae are normal.  Neck: Normal range of motion.  Cardiovascular: Normal rate, regular rhythm and normal heart sounds.  No murmur heard. Pulmonary/Chest: Effort normal and breath sounds normal. She has no wheezes.  Neurological: She is alert and oriented to person, place, and time.  Skin: Skin is warm and dry.  Psychiatric: She has a normal mood and affect. Her behavior is normal. Judgment and thought content normal.  Vitals reviewed.  Results for orders placed or performed in visit on 09/06/18  CBC with Differential/Platelet  Result Value Ref Range   WBC 8.1 3.4 - 10.8 x10E3/uL   RBC 4.51 3.77 - 5.28 x10E6/uL   Hemoglobin 13.8 11.1 - 15.9 g/dL   Hematocrit 40.5 34.0 - 46.6 %   MCV 90 79 - 97 fL   MCH 30.6 26.6 - 33.0 pg   MCHC 34.1 31.5 - 35.7 g/dL   RDW 12.6 12.3 - 15.4 %   Platelets 325 150 - 450 x10E3/uL   Neutrophils 50 Not Estab. %   Lymphs 40 Not Estab. %   Monocytes 6 Not Estab. %   Eos 3 Not Estab. %   Basos 1 Not Estab. %   Neutrophils Absolute 4.1 1.4 - 7.0 x10E3/uL   Lymphocytes Absolute 3.3 (H) 0.7 - 3.1 x10E3/uL   Monocytes Absolute 0.5 0.1 - 0.9 x10E3/uL   EOS (ABSOLUTE) 0.3 0.0 - 0.4 x10E3/uL   Basophils Absolute 0.1 0.0 - 0.2 x10E3/uL   Immature Granulocytes 0 Not Estab. %   Immature Grans (Abs) 0.0 0.0 - 0.1 [id number]  Basic metabolic panel  Result Value Ref Range   Glucose 83 65 - 99 mg/dL   BUN 9 6 - 24 mg/dL   Creatinine, Ser 0.65 0.57 - 1.00 mg/dL   GFR calc non Af Amer 107 >59 mL/min/1.73   GFR calc Af Amer 123 >59 mL/min/1.73   BUN/Creatinine Ratio 14 9 - 23   Sodium 140 134 - 144 mmol/L   Potassium 4.0 3.5 - 5.2 mmol/L   Chloride 104 96 - 106 mmol/L   CO2 24 20 - 29 mmol/L   Calcium 9.4 8.7 - 10.2 mg/dL  Protime-INR  Result Value Ref Range   INR 1.0 0.8 - 1.2     Prothrombin Time 10.7 9.1 - 12.0 sec  Urinalysis, dipstick only  Result Value Ref Range   Specific Gravity, UA 1.014 1.005 - 1.030   pH, UA 7.5 5.0 - 7.5   Color, UA Yellow Yellow   Appearance Ur Clear Clear   Leukocytes, UA 2+ (A) Negative   Protein, UA Negative Negative/Trace   Glucose, UA Negative Negative   Ketones, UA Negative Negative   RBC, UA Negative Negative   Bilirubin, UA Negative Negative   Urobilinogen, Ur 0.2 0.2 - 1.0 mg/dL   Nitrite, UA Negative Negative  hCG, serum, qualitative  Result Value Ref Range   hCG,Beta Subunit,Qual,Serum Negative Negative <6 mIU/mL  PT AND PTT  Result Value Ref Range   INR 1.0 0.8 - 1.2   Prothrombin Time 10.4 9.1 - 12.0 sec   aPTT 28 24 - 33 sec   Rhythm: sinus rhythm at a rate of 70. Findings: sinus rhythm with rate variation - otherwise normal Last EKG: 06/2018  Changes from last EKG: No I have personally reviewed the EKG tracing and agree with the computerized printout.   Assessment and Plan :  Pre-operative clearance - Plan: DG Chest 2 View, EKG 12-Lead, CBC with Differential/Platelet, Basic metabolic panel, Protime-INR, Ptt factor inhibitor (mixing study), Urinalysis, dipstick only, hCG, serum, qualitative  Severe persistent asthma without complication - Plan: DG Chest 2 View  Restless leg   Check labs and fax to office.  Pt is low risk for surgery.  Patient verbalized to me that they understand the following: diagnosis, what is being done for them, what to expect and what should be done at home.  Their questions have been answered.  See after visit summary  for patient specific instructions.  Windell Hummingbird PA-C  [facility] 09/06/2018 11:38 AM  Please note: Portions of this report may have been transcribed using dragon voice recognition software. Every effort was made to ensure accuracy; however, inadvertent computerized transcription errors may be present.

## 2018-09-06 NOTE — Patient Instructions (Signed)
° ° ° °  If you have lab work done today you will be contacted with your lab results within the next 2 weeks.  If you have not heard from us then please contact us. The fastest way to get your results is to register for My Chart. ° ° °IF you received an x-ray today, you will receive an invoice from [facility]. Please contact [facility] at [phone] with questions or concerns regarding your invoice.  ° °IF you received labwork today, you will receive an invoice from [facility]. Please contact [facility] at [phone] with questions or concerns regarding your invoice.  ° °Our billing staff will not be able to assist you with questions regarding bills from these companies. ° °You will be contacted with the lab results as soon as they are available. The fastest way to get your results is to activate your My Chart account. Instructions are located on the last page of this paperwork. If you have not heard from us regarding the results in 2 weeks, please contact this office. °  ° ° ° °

## 2018-09-07 LAB — CBC WITH DIFFERENTIAL/PLATELET
BASOS ABS: 0.1 10*3/uL (ref 0.0–0.2)
BASOS: 1 %
EOS (ABSOLUTE): 0.3 10*3/uL (ref 0.0–0.4)
Eos: 3 %
Hematocrit: 40.5 % (ref 34.0–46.6)
Hemoglobin: 13.8 g/dL (ref 11.1–15.9)
IMMATURE GRANS (ABS): 0 10*3/uL (ref 0.0–0.1)
IMMATURE GRANULOCYTES: 0 %
LYMPHS: 40 %
Lymphocytes Absolute: 3.3 10*3/uL — ABNORMAL HIGH (ref 0.7–3.1)
MCH: 30.6 pg (ref 26.6–33.0)
MCHC: 34.1 g/dL (ref 31.5–35.7)
MCV: 90 fL (ref 79–97)
MONOS ABS: 0.5 10*3/uL (ref 0.1–0.9)
Monocytes: 6 %
NEUTROS PCT: 50 %
Neutrophils Absolute: 4.1 10*3/uL (ref 1.4–7.0)
PLATELETS: 325 10*3/uL (ref 150–450)
RBC: 4.51 x10E6/uL (ref 3.77–5.28)
RDW: 12.6 % (ref 12.3–15.4)
WBC: 8.1 10*3/uL (ref 3.4–10.8)

## 2018-09-07 LAB — URINALYSIS, DIPSTICK ONLY
Bilirubin, UA: NEGATIVE
GLUCOSE, UA: NEGATIVE
Ketones, UA: NEGATIVE
Nitrite, UA: NEGATIVE
PH UA: 7.5 (ref 5.0–7.5)
PROTEIN UA: NEGATIVE
RBC UA: NEGATIVE
SPEC GRAV UA: 1.014 (ref 1.005–1.030)
Urobilinogen, Ur: 0.2 mg/dL (ref 0.2–1.0)

## 2018-09-07 LAB — BASIC METABOLIC PANEL
BUN/Creatinine Ratio: 14 (ref 9–23)
BUN: 9 mg/dL (ref 6–24)
CALCIUM: 9.4 mg/dL (ref 8.7–10.2)
CHLORIDE: 104 mmol/L (ref 96–106)
CO2: 24 mmol/L (ref 20–29)
Creatinine, Ser: 0.65 mg/dL (ref 0.57–1.00)
GFR calc non Af Amer: 107 mL/min/{1.73_m2} (ref >59)
GFR, EST AFRICAN AMERICAN: 123 mL/min/{1.73_m2} (ref >59)
GLUCOSE: 83 mg/dL (ref 65–99)
Potassium: 4 mmol/L (ref 3.5–5.2)
Sodium: 140 mmol/L (ref 134–144)

## 2018-09-07 LAB — PT AND PTT
APTT: 28 s (ref 24–33)
INR: 1 (ref 0.8–1.2)
Prothrombin Time: 10.4 s (ref 9.1–12.0)

## 2018-09-07 LAB — PROTIME-INR
INR: 1 (ref 0.8–1.2)
Prothrombin Time: 10.7 s (ref 9.1–12.0)

## 2018-09-07 LAB — HCG, SERUM, QUALITATIVE: hCG,Beta Subunit,Qual,Serum: NEGATIVE m[IU]/mL (ref <6)

## 2018-09-22 ENCOUNTER — Ambulatory Visit (INDEPENDENT_AMBULATORY_CARE_PROVIDER_SITE_OTHER): Payer: Self-pay | Admitting: Physician Assistant

## 2018-09-22 ENCOUNTER — Other Ambulatory Visit: Payer: Self-pay

## 2018-09-22 ENCOUNTER — Encounter: Payer: Self-pay | Admitting: Physician Assistant

## 2018-09-22 VITALS — BP 103/69 | HR 114 | Temp 98.0°F | Resp 18 | Ht 68.75 in | Wt 209.2 lb

## 2018-09-22 DIAGNOSIS — Z4802 Encounter for removal of sutures: Secondary | ICD-10-CM

## 2018-09-22 NOTE — Progress Notes (Signed)
Deborah Cordova  MRN: [id number] DOB: 1971-03-07  PCP: Forrest Moron, MD  Subjective:  [name] who presents to clinic for suture removal s/p tummy tuck and behind lift. She had procedure done in [place] and is happy with results. Endorses soreness of abdomen, breasts and buttocks.  Denies swelling, pain, redness, drainage of pus or bleeding.    Review of Systems  Constitutional: Negative for chills and fever.  Gastrointestinal: Negative for abdominal pain, nausea and vomiting.  Genitourinary: Negative for dysuria, flank pain, frequency and urgency.  Musculoskeletal: Negative for back pain.  Skin: Positive for wound.    Patient Active Problem List   Diagnosis Date Noted  . Restless leg 09/06/2018  . Severe persistent asthma without complication   . Essential hypertension 10/01/2015  . Gastroesophageal reflux disease without esophagitis 10/01/2015    Current Outpatient Medications on File Prior to Visit  Medication Sig Dispense Refill  . acyclovir (ZOVIRAX) 400 MG tablet Take 400 mg by mouth as needed (For fever blisters.).    Marland Kitchen albuterol (ACCUNEB) 0.63 MG/3ML nebulizer solution Inhale into the lungs.    Marland Kitchen albuterol (PROAIR HFA) 108 (90 Base) MCG/ACT inhaler Inhale 2 puffs into the lungs every 4 (four) hours as needed for wheezing or shortness of breath. 3 Inhaler 0  . amLODipine (NORVASC) 5 MG tablet Take 1 tablet (5 mg total) by mouth daily. 90 tablet 3  . B-D 3CC LUER-LOK SYR 25GX1" 25G X 1" 3 ML MISC   1  . budesonide-formoterol (SYMBICORT) 160-4.5 MCG/ACT inhaler Take 2 puffs first thing in am and then another 2 puffs about 12 hours later. 3 Inhaler 1  . Butalbital-APAP-Caffeine 50-300-40 MG CAPS Take by mouth as needed.    . cetirizine (ZYRTEC ALLERGY) 10 MG tablet Take 10 mg by mouth daily.    . cyclobenzaprine (FLEXERIL) 10 MG tablet Take 1 tablet (10 mg total) by mouth 3 (three) times daily as needed for muscle spasms. 30 tablet 0  . EPINEPHrine 0.3  mg/0.3 mL IJ SOAJ injection   0  . fluticasone (FLONASE) 50 MCG/ACT nasal spray Place 1 spray into both nostrils 2 (two) times daily. 16 g 6  . gabapentin (NEURONTIN) 300 MG capsule Take 1 capsule (300 mg total) by mouth at bedtime. 90 capsule 1  . ipratropium (ATROVENT) 0.03 % nasal spray Place into the nose.    . montelukast (SINGULAIR) 10 MG tablet Take 1 tablet (10 mg total) by mouth at bedtime. 90 tablet 3  . Olopatadine HCl 0.2 % SOLN Apply 1 drop to eye daily. 2.5 mL 0  . omalizumab (XOLAIR) 150 MG injection Inject 300 mg under the skin every 2 weeks. 4 vial 11  . ranitidine (ZANTAC) 150 MG tablet Take 1 tablet (150 mg total) by mouth 2 (two) times daily. 60 tablet 11  . triamcinolone ointment (KENALOG) 0.1 % Apply 1 application topically 2 (two) times daily. 30 g 0  . Water For Injection Sterile (STERILE WATER, PRESERVATIVE FREE,) injection   3   No current facility-administered medications on file prior to visit.     Allergies  Allergen Reactions  . Aspirin Other (See Comments) and Shortness Of Breath    Asthma flare up - wheezing Asthma attack  . Nsaids Other (See Comments) and Shortness Of Breath    Asthma flare up - wheezing  Asthma attack  . Sulfamethoxazole-Trimethoprim Hives, Swelling, Rash and Other (See Comments)    Whelps and knots   . Tolmetin  Shortness Of Breath    Asthma flare up - wheezing  . Cantaloupe (Diagnostic) Itching and Other (See Comments)    Makes tongue itch  . Other Other (See Comments)    Pecans and walnuts---induce asthma attack   . Pineapple Other (See Comments)    Tongue bleeds  . Kiwi Extract Itching and Other (See Comments)    Makes tongue itch Makes tongue itch  . Pecan Extract Allergy Skin Test Itching    Pecans and walnuts---induce asthma attack   . Phenazopyridine Hcl Itching    Itching, swelling and burning Itching, swelling and burning     Objective:  BP 103/69   Pulse (!) 114   Temp 98 F (36.7 C) (Oral)   Resp 18   Ht 5'  8.75" (1.746 m)   Wt 209 lb 3.2 oz (94.9 kg)   LMP 03/29/2016 (Approximate)   SpO2 97%   BMI 31.12 kg/m   Physical Exam  Constitutional: She is oriented to person, place, and time. No distress.  Neurological: She is alert and oriented to person, place, and time.  Skin: Skin is warm and dry.     8 sutures removed without complication. No erythema, drainage, swelling. Mild TTP  Psychiatric: Judgment normal.  Vitals reviewed.   Assessment and Plan :  1. Encounter for removal of sutures - Pt presents to clinic for suture removal s/p tummy tuck and behind lift in [place]. Eight sutures removed without difficulty. No sign of infection appreciated. Wound care discussed. Spent 20 mins with the patient - greater than 50% of the visit was face to face counseling patient regarding wound care and post operative home care concerns.    Mercer Pod, PA-C  [facility] 09/22/2018 1:41 PM  Please note: Portions of this report may have been transcribed using dragon voice recognition software. Every effort was made to ensure accuracy; however, inadvertent computerized transcription errors may be present.

## 2018-09-22 NOTE — Patient Instructions (Signed)
° ° ° °  If you have lab work done today you will be contacted with your lab results within the next 2 weeks.  If you have not heard from us then please contact us. The fastest way to get your results is to register for My Chart. ° ° °IF you received an x-ray today, you will receive an invoice from [facility]. Please contact [facility] at [phone] with questions or concerns regarding your invoice.  ° °IF you received labwork today, you will receive an invoice from [facility]. Please contact [facility] at [phone] with questions or concerns regarding your invoice.  ° °Our billing staff will not be able to assist you with questions regarding bills from these companies. ° °You will be contacted with the lab results as soon as they are available. The fastest way to get your results is to activate your My Chart account. Instructions are located on the last page of this paperwork. If you have not heard from us regarding the results in 2 weeks, please contact this office. °  ° ° ° °

## 2018-10-18 ENCOUNTER — Other Ambulatory Visit: Payer: Self-pay | Admitting: Family Medicine

## 2018-10-18 DIAGNOSIS — Z760 Encounter for issue of repeat prescription: Secondary | ICD-10-CM

## 2018-10-19 NOTE — Telephone Encounter (Signed)
Requested Prescriptions  Pending Prescriptions Disp Refills  . montelukast (SINGULAIR) 10 MG tablet [Pharmacy Med Name: MONTELUKAST SOD 10 MG TAB 10 TAB] 90 tablet 3    Sig: TAKE 1 TABLET BY MOUTH AT BEDTIME     Pulmonology:  Leukotriene Inhibitors Passed - 10/18/2018 10:41 AM      Passed - Valid encounter within last 12 months    Recent Outpatient Visits          3 weeks ago Encounter for removal of sutures   [facility] at [facility], Gelene Mink, PA-C   1 month ago Pre-operative clearance   [facility] at [facility], PA-C   2 months ago Chronic right-sided low back pain without sciatica   [facility] Care at [facility], Gelene Mink, PA-C   2 months ago Itching of ear   [facility] at [facility], Arlie Solomons, MD   3 months ago Preoperative testing   Primary Care at [facility], [place] D, [place]

## 2018-10-21 ENCOUNTER — Other Ambulatory Visit: Payer: Self-pay | Admitting: Family Medicine

## 2018-10-21 ENCOUNTER — Other Ambulatory Visit: Payer: Self-pay

## 2018-10-21 MED ORDER — ALBUTEROL SULFATE HFA 108 (90 BASE) MCG/ACT IN AERS: 2.0000 | INHALATION_SPRAY | RESPIRATORY_TRACT | 0 refills | Status: DC | PRN

## 2018-10-27 MED FILL — raNITIdine HCL 150 MG TABS: 150 | 90 days supply | Qty: 180 | Fill #0

## 2018-10-27 MED FILL — AMLODIPINE BESYLATE 5 MG TA: 5 | 90 days supply | Qty: 90 | Fill #3

## 2018-10-27 MED FILL — MONTELUKAST SOD 10 MG TAB: 10 | 90 days supply | Qty: 90 | Fill #0

## 2018-11-08 IMAGING — DX DG HAND COMPLETE 3+V*L*
3 series · 3 of 3 positions shown · non-contrast
Comparison: None in PACs

CLINICAL DATA: Left finger pain.  No report of injury.

EXAM:
LEFT HAND - COMPLETE 3+ VIEW

[hand pa]
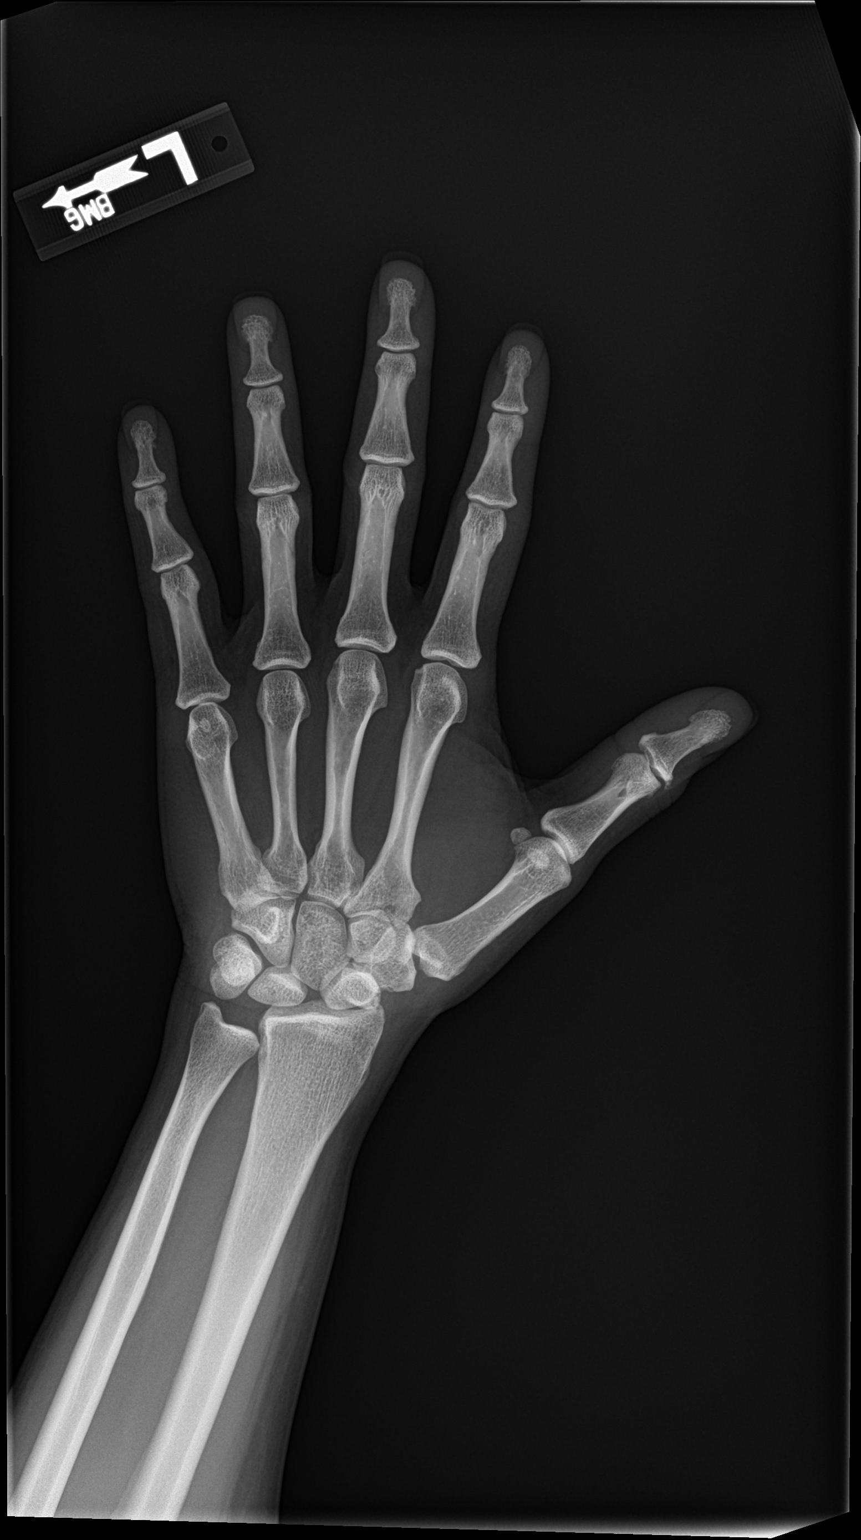

[hand obl]
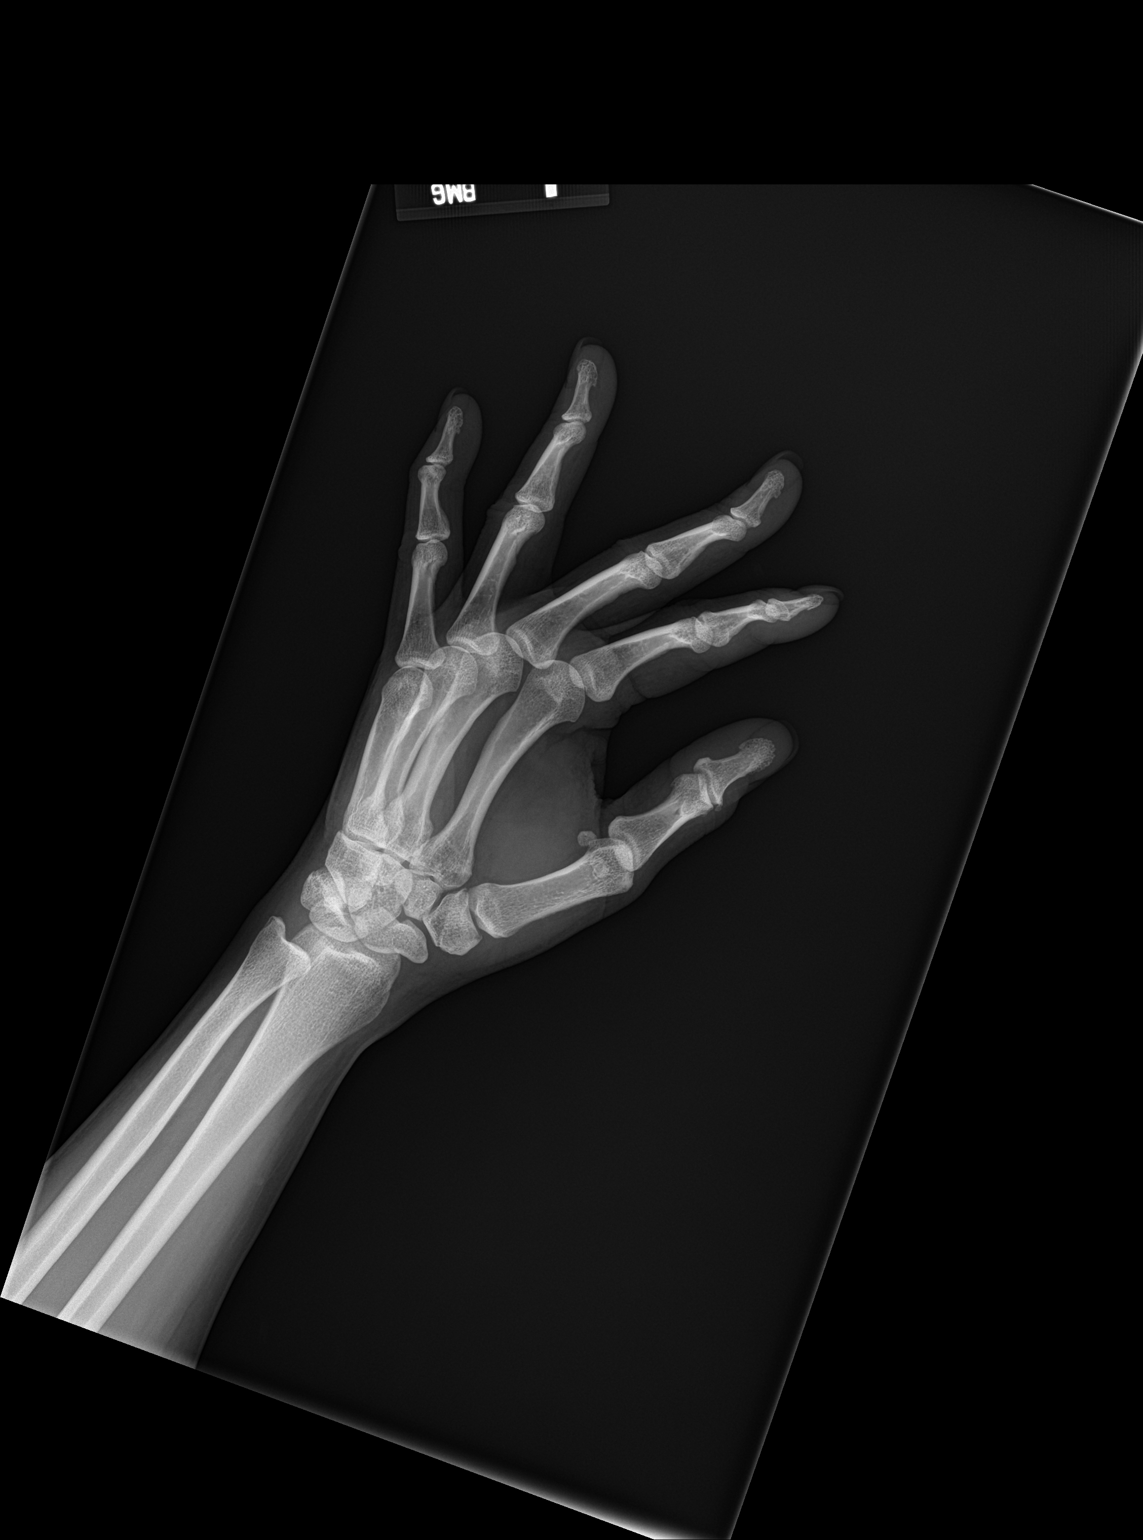

[hand lat]
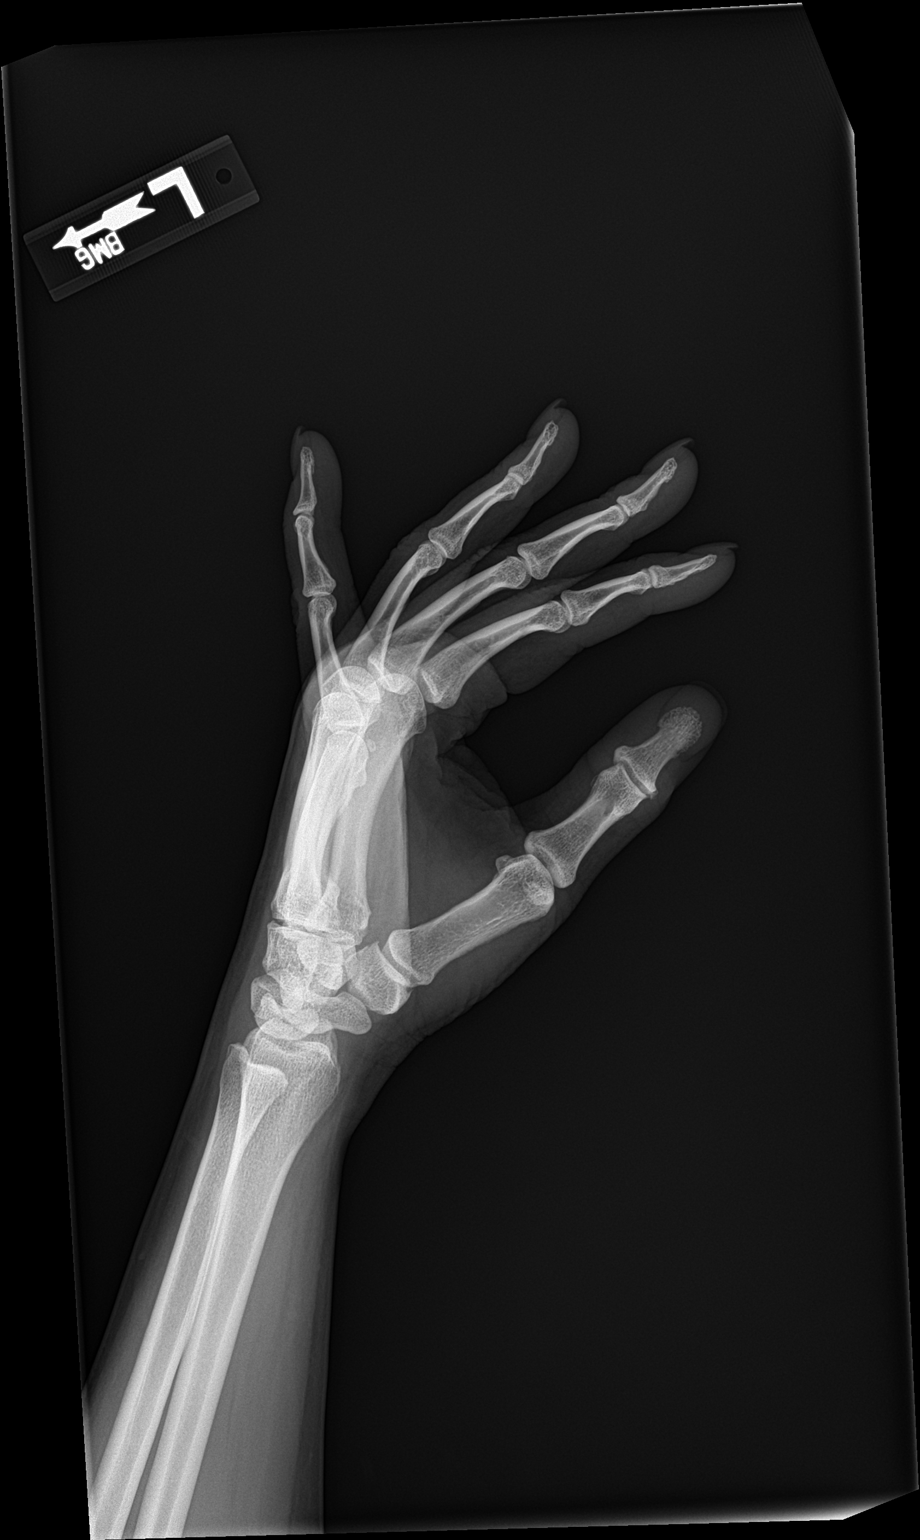

[3 of 3 positions shown; findings below may reference images not displayed]

FINDINGS: The bones are subjectively adequately mineralized. The joint spaces
are reasonably well-maintained. There is no acute or healing
fracture. There is no dislocation. There is no significant joint
effusion.
IMPRESSION: There is no acute or significant chronic bony abnormality of the
left hand.

## 2019-01-22 IMAGING — DX DG FINGER INDEX 2+V*L*
3 series · 3 of 3 positions shown · non-contrast
Comparison: Left hand x-ray 10/05/2017.

CLINICAL DATA: Pain involving the PIP joint of the left index
finger of unspecified chronicity.

EXAM:
LEFT INDEX FINGER 2+V

[finger ap]
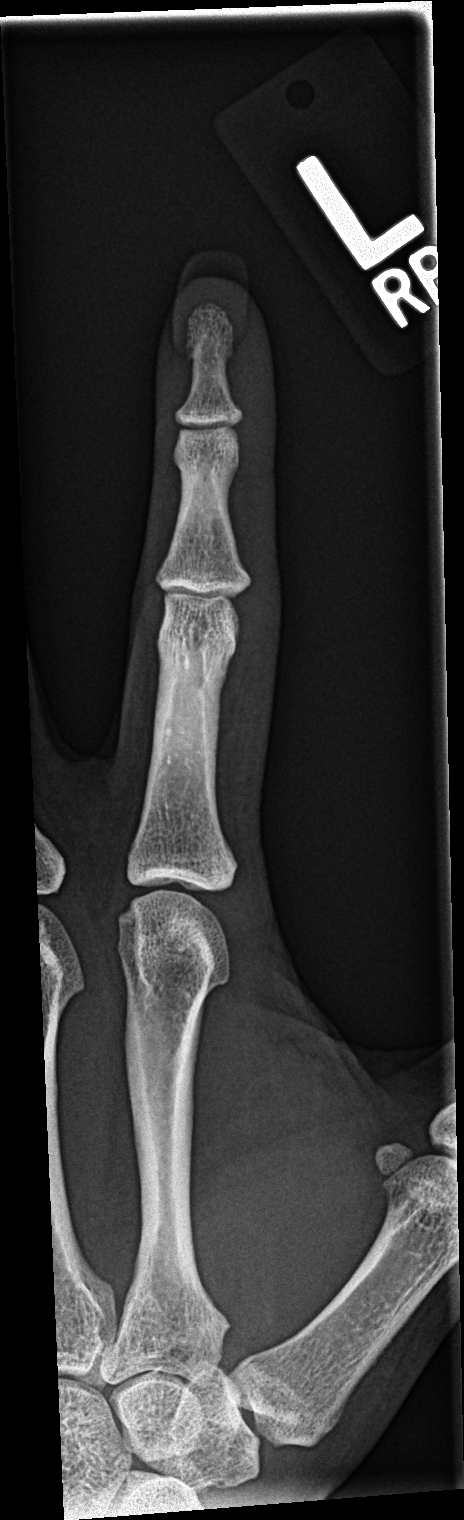

[finger obl]
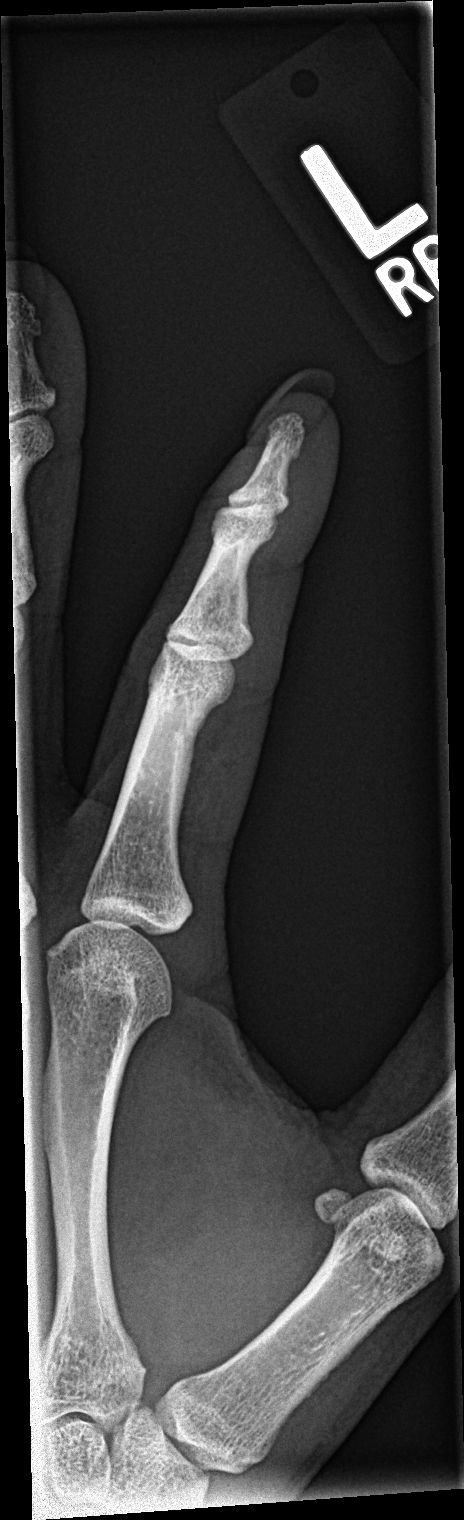

[finger lat]
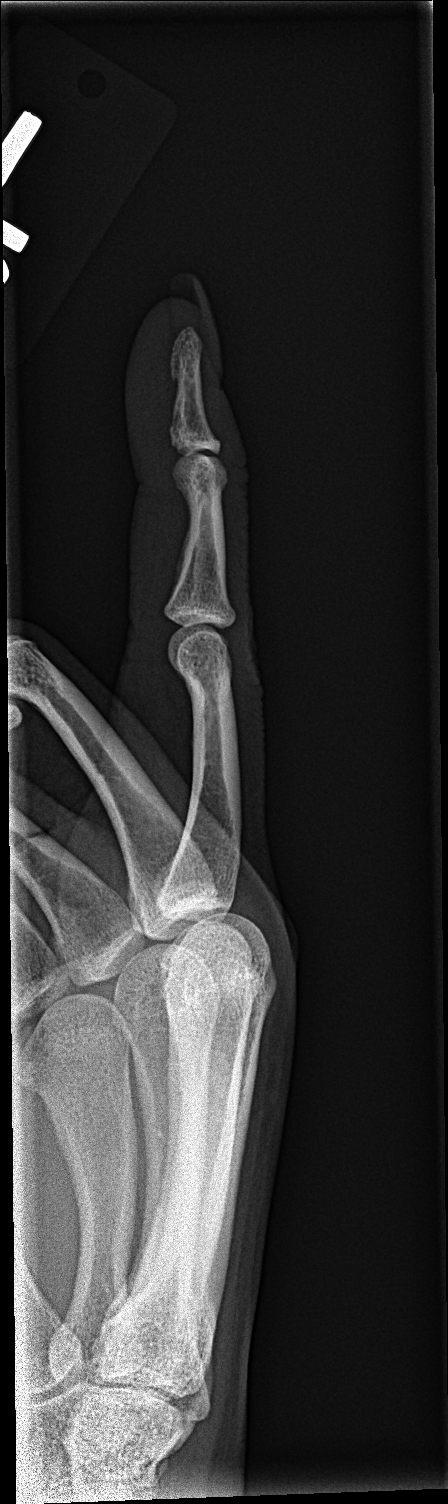

[3 of 3 positions shown; findings below may reference images not displayed]

FINDINGS: No evidence of acute fracture or dislocation. Joint spaces well
preserved. Well-preserved bone mineral density. No intrinsic osseous
abnormalities.
IMPRESSION: Normal examination.

## 2019-07-25 ENCOUNTER — Encounter: Payer: Self-pay | Admitting: Family Medicine

## 2019-07-25 ENCOUNTER — Other Ambulatory Visit: Payer: Self-pay

## 2019-07-25 ENCOUNTER — Ambulatory Visit (INDEPENDENT_AMBULATORY_CARE_PROVIDER_SITE_OTHER): Payer: Self-pay | Admitting: Family Medicine

## 2019-07-25 VITALS — BP 133/80 | HR 89 | Temp 99.0°F | Resp 16 | Ht 68.0 in | Wt 211.4 lb

## 2019-07-25 DIAGNOSIS — H60393 Other infective otitis externa, bilateral: Secondary | ICD-10-CM

## 2019-07-25 DIAGNOSIS — Z20822 Contact with and (suspected) exposure to covid-19: Secondary | ICD-10-CM

## 2019-07-25 DIAGNOSIS — J4541 Moderate persistent asthma with (acute) exacerbation: Secondary | ICD-10-CM

## 2019-07-25 DIAGNOSIS — Z20828 Contact with and (suspected) exposure to other viral communicable diseases: Secondary | ICD-10-CM

## 2019-07-25 DIAGNOSIS — Z760 Encounter for issue of repeat prescription: Secondary | ICD-10-CM

## 2019-07-25 MED ORDER — ALBUTEROL SULFATE HFA 108 (90 BASE) MCG/ACT IN AERS: 2.0000 | INHALATION_SPRAY | RESPIRATORY_TRACT | 1 refills | Status: DC | PRN

## 2019-07-25 MED ORDER — NEOMYCIN-POLYMYXIN-HC 3.5-10000-1 OT SOLN: 4.0000 [drp] | Freq: Four times a day (QID) | OTIC | 0 refills | Status: AC

## 2019-07-25 MED ORDER — BUDESONIDE-FORMOTEROL FUMARATE 160-4.5 MCG/ACT IN AERO: INHALATION_SPRAY | RESPIRATORY_TRACT | 1 refills | Status: AC

## 2019-07-25 MED ORDER — MONTELUKAST SODIUM 10 MG PO TABS: 10.0000 mg | ORAL_TABLET | Freq: Every day | ORAL | 3 refills | Status: AC

## 2019-07-25 NOTE — Progress Notes (Signed)
Subjective:    Patient ID: Deborah Cordova, female    DOB: December 27, 1971, 48 y.o.   MRN: [id number]  HPI Deborah Cordova is a 48 y.o. female Presents today for: Chief Complaint  Patient presents with  . Ear Pain    x 2 days of RIGHT with swelling. Patient would like to had a covid test done.  . Medication Refill    ProAir HFA and Epi-Pen  moved back to [place] in May.  Starts new job Aug 17th.   R ear pain: Both ears, itching for past 11 months. Tried some ear drops - pataday eye drops - tried 1-2 drops once per day - some relief. No recent pataday.  Past few weeks more itchy and sore at times. No qtips. No recent drops. No recent swimming. Itches with fingertip only.   R more sore and feels swollen. R ear swollen 3 days.. Lymph node behind R ear -past 2 days.  No fever.  Muffled on R, hearing ok on left.   Asthma: Severe persistent by problem list.  PCP is Forrest Moron, MD but has not had visit with PCP since last year. previosly on Xolair - off since August 2019, d/t insurance lapse.  Still taking Singulair daily. Symbicort 160/4.5 - 2 puffs BID.  Request refill of pro-air HFA - uses proair more recently - 3-4 times per day past few weeks (with change in weather). Prior 1-2 times per day.  Allergist: Douglas @[facility]  internal med- no visit in 1 year.  On zyrtec 10mg  QD - allergies controlled. No nasal spray.   EpiPen refill Multiple allergies including food allergies per problem list, and if needed for Xolair.  Needs refill on EpiPen - will expire in September.   Wants covid test, no symptoms, just traveled from [facility]. No known specific exposure, but in healthcare. Had negative testing in [place].   Patient Active Problem List   Diagnosis Date Noted  . Restless leg 09/06/2018  . Severe persistent asthma without complication   . Essential hypertension 10/01/2015  . Gastroesophageal reflux disease without esophagitis 10/01/2015   Past Medical History:  Diagnosis Date  . Asthma   .  COPD (chronic obstructive pulmonary disease) ([place])   . Fibroid   . Genital warts   . Hypertension   . PID (pelvic inflammatory disease)   . Renal disorder    Past Surgical History:  Procedure Laterality Date  . COLPOSCOPY    . DILATION AND CURETTAGE OF UTERUS    . LAPAROSCOPIC TOTAL HYSTERECTOMY    . laser genital warts     Allergies  Allergen Reactions  . Aspirin Other (See Comments) and Shortness Of Breath    Asthma flare up - wheezing Asthma attack  . Nsaids Other (See Comments) and Shortness Of Breath    Asthma flare up - wheezing  Asthma attack  . Sulfamethoxazole-Trimethoprim Hives, Swelling, Rash and Other (See Comments)    Whelps and knots   . Tolmetin Shortness Of Breath    Asthma flare up - wheezing  . Cantaloupe (Diagnostic) Itching and Other (See Comments)    Makes tongue itch  . Other Other (See Comments)    Pecans and walnuts---induce asthma attack   . Pineapple Other (See Comments)    Tongue bleeds  . Kiwi Extract Itching and Other (See Comments)    Makes tongue itch Makes tongue itch  . Pecan Extract Allergy Skin Test Itching    Pecans and walnuts---induce asthma attack   . Phenazopyridine Hcl Itching  Itching, swelling and burning Itching, swelling and burning   Prior to Admission medications   Medication Sig Start Date End Date Taking? Authorizing Provider  acyclovir (ZOVIRAX) 400 MG tablet Take 400 mg by mouth as needed (For fever blisters.).   Yes [provider]  albuterol (PROAIR HFA) 108 (90 Base) MCG/ACT inhaler Inhale 2 puffs into the lungs every 4 (four) hours as needed for wheezing or shortness of breath. 10/21/18  Yes Stallings, Zoe A, MD  amLODipine (NORVASC) 5 MG tablet Take 1 tablet (5 mg total) by mouth daily. 12/14/17  Yes Forrest Moron, MD  budesonide-formoterol (SYMBICORT) 160-4.5 MCG/ACT inhaler Take 2 puffs first thing in am and then another 2 puffs about 12 hours later. 12/14/17  Yes Tereasa Coop, PA-C   Butalbital-APAP-Caffeine 50-300-40 MG CAPS Take by mouth as needed.   Yes [provider]  cetirizine (ZYRTEC ALLERGY) 10 MG tablet Take 10 mg by mouth daily.   Yes [provider]  EPINEPHrine 0.3 mg/0.3 mL IJ SOAJ injection  05/06/18  Yes [provider]  fluticasone (FLONASE) 50 MCG/ACT nasal spray Place 1 spray into both nostrils 2 (two) times daily. 05/06/18  Yes Rutherford Guys, MD  gabapentin (NEURONTIN) 300 MG capsule Take 1 capsule (300 mg total) by mouth at bedtime. 09/29/17  Yes Stallings, Zoe A, MD  montelukast (SINGULAIR) 10 MG tablet TAKE 1 TABLET BY MOUTH AT BEDTIME 10/19/18  Yes Forrest Moron, MD  OVER THE COUNTER MEDICATION    Yes [provider]  triamcinolone ointment (KENALOG) 0.1 % Apply 1 application topically 2 (two) times daily. 06/01/18  Yes Jeffery, Chelle, PA  cyclobenzaprine (FLEXERIL) 10 MG tablet Take 1 tablet (10 mg total) by mouth 3 (three) times daily as needed for muscle spasms. Patient not taking: Reported on 07/25/2019 08/16/18   McVey, Gelene Mink, PA-C  ipratropium (ATROVENT) 0.03 % nasal spray Place into the nose. 05/07/17   [provider]  Olopatadine HCl 0.2 % SOLN Apply 1 drop to eye daily. Patient not taking: Reported on 07/25/2019 08/06/18   Forrest Moron, MD  omalizumab Arvid Right) 150 MG injection Inject 300 mg under the skin every 2 weeks. Patient not taking: Reported on 07/25/2019 05/12/18   Tresa Garter, MD  ranitidine (ZANTAC) 150 MG tablet Take 1 tablet (150 mg total) by mouth 2 (two) times daily. Patient not taking: Reported on 07/25/2019 02/03/18   Forrest Moron, MD  Water For Injection Sterile (STERILE WATER, PRESERVATIVE FREE,) injection  05/10/18   [provider]   Social History   Socioeconomic History  . Marital status: Married    Spouse name: Not on file  . Number of children: 4  . Years of education: Not on file  . Highest education level: Not on file  Occupational History   . Not on file  Social Needs  . Financial resource strain: Not on file  . Food insecurity    Worry: Not on file    Inability: Not on file  . Transportation needs    Medical: Not on file    Non-medical: Not on file  Tobacco Use  . Smoking status: Never Smoker  . Smokeless tobacco: Never Used  Substance and Sexual Activity  . Alcohol use: Yes    Alcohol/week: 1.0 - 3.0 standard drinks    Types: 1 - 3 Glasses of wine per week  . Drug use: No  . Sexual activity: Yes    Partners: Male    Birth  control/protection: Surgical    Comment: hysterectomy  Lifestyle  . Physical activity    Days per week: Not on file    Minutes per session: Not on file  . Stress: Not on file  Relationships  . Social Herbalist on phone: Not on file    Gets together: Not on file    Attends religious service: Not on file    Active member of club or organization: Not on file    Attends meetings of clubs or organizations: Not on file    Relationship status: Not on file  . Intimate partner violence    Fear of current or ex partner: Not on file    Emotionally abused: Not on file    Physically abused: Not on file    Forced sexual activity: Not on file  Other Topics Concern  . Not on file  Social History Narrative  . Not on file    Review of Systems Per HPI;     Objective:   Physical Exam Vitals signs reviewed.  Constitutional:      General: She is not in acute distress.    Appearance: She is well-developed.  HENT:     Head: Normocephalic and atraumatic.      Right Ear: External ear normal. Swelling (Canal edema with some dry/scaling external canal.  Visualized TM appeared to be pearly gray, not completely occluded canal.  Somewhat guarded exam.) and tenderness (Pain with motion of pinna, but pinna without significant soft tissue swelling externally.) present. No laceration or drainage. There is no impacted cerumen. No foreign body. No mastoid tenderness.     Left Ear: External ear  normal. Swelling (Minimal edema of the left canal, slight scaling and minimal excoriation of the distal canal.) and tenderness present. No laceration or drainage. There is no impacted cerumen. No foreign body. No mastoid tenderness.     Nose: Nose normal.     Mouth/Throat:     Pharynx: No oropharyngeal exudate.  Eyes:     Conjunctiva/sclera: Conjunctivae normal.     Pupils: Pupils are equal, round, and reactive to light.  Cardiovascular:     Rate and Rhythm: Normal rate and regular rhythm.     Heart sounds: Normal heart sounds. No murmur.  Pulmonary:     Effort: Pulmonary effort is normal. No respiratory distress.     Breath sounds: Examination of the left-lower field reveals wheezing. Wheezing (Faint single end expiratory wheeze heard at lower left only, clear on repeat exam) present. No rhonchi.  Skin:    General: Skin is warm and dry.     Findings: No rash.  Neurological:     Mental Status: She is alert and oriented to person, place, and time.  Psychiatric:        Behavior: Behavior normal.    Vitals:   07/25/19 1603  BP: 133/80  Pulse: 89  Resp: 16  Temp: 99 F (37.2 C)  TempSrc: Oral  SpO2: 97%  Weight: 211 lb 6.4 oz (95.9 kg)  Height: 5\' 8"  (1.727 m)       Assessment & Plan:    Tonimarie Gritz is a 48 y.o. female Close Exposure to Covid-19 Virus - Plan: Novel Coronavirus, NAA ([facility])  - asymptomatic. Testing performed based on travel and risk with asthma.   Moderate persistent asthma with acute exacerbation - Plan: albuterol (PROAIR HFA) 108 (90 Base) MCG/ACT inhaler, budesonide-formoterol (SYMBICORT) 160-4.5 MCG/ACT inhaler Encounter for medication refill - Plan: montelukast (SINGULAIR) 10 MG tablet  -  decreased control off Xolair and with weather change. May have improved insurance coverage in the next few weeks.  Refilled Symbicort for now, albuterol for now, but if persistent need for frequent dosing of albuterol, would consider short burst of prednisone if  Xolair cost prohibitive.  -Has EpiPen still in date, will hold on refill at this time.  Infective otitis externa of both ears - Plan: neomycin-polymyxin-hydrocortisone (CORTISPORIN) OTIC solution  -Possible eczema component with pruritus, now with secondary otitis externa, right greater than left.  No fevers, no true signs of external cellulitis at this point, mastoid nontender.  -Start Cortisporin otic with close follow-up in 48 hours for repeat exam.  RTC/ER precautions if worsening sooner  Meds ordered this encounter  Medications  . albuterol (PROAIR HFA) 108 (90 Base) MCG/ACT inhaler    Sig: Inhale 2 puffs into the lungs every 4 (four) hours as needed for wheezing or shortness of breath.    Dispense:  18 g    Refill:  1  . budesonide-formoterol (SYMBICORT) 160-4.5 MCG/ACT inhaler    Sig: Take 2 puffs first thing in am and then another 2 puffs about 12 hours later.    Dispense:  3 Inhaler    Refill:  1  . montelukast (SINGULAIR) 10 MG tablet    Sig: Take 1 tablet (10 mg total) by mouth at bedtime.    Dispense:  90 tablet    Refill:  3  . neomycin-polymyxin-hydrocortisone (CORTISPORIN) OTIC solution    Sig: Place 4 drops into both ears 4 (four) times daily.    Dispense:  20 mL    Refill:  0   Patient Instructions    Symbicort refilled at same dose for now, but if you continue to require albuterol frequently I would discuss with your allergist if other medicines need to be started, or would consider prednisone taper.  Can follow-up on this in the next few days.  For  otitis externa, which appears to be worse in the right than the left, start antibiotic drops 4 times per day and recheck in the next 2 days to make sure that is treating that infection sufficiently.  If any fevers or worsening symptoms be seen sooner.   Otitis Externa  Otitis externa is an infection of the outer ear canal. The outer ear canal is the area between the outside of the ear and the eardrum. Otitis externa  is sometimes called swimmer's ear. What are the causes? Common causes of this condition include:  Swimming in dirty water.  Moisture in the ear.  An injury to the inside of the ear.  An object stuck in the ear.  A cut or scrape on the outside of the ear. What increases the risk? You are more likely to develop this condition if you go swimming often. What are the signs or symptoms? The first symptom of this condition is often itching in the ear. Later symptoms of the condition include:  Swelling of the ear.  Redness in the ear.  Ear pain. The pain may get worse when you pull on your ear.  Pus coming from the ear. How is this diagnosed? This condition may be diagnosed by examining the ear and testing fluid from the ear for bacteria and funguses. How is this treated? This condition may be treated with:  Antibiotic ear drops. These are often given for 10-14 days.  Medicines to reduce itching and swelling. Follow these instructions at home:  If you were prescribed antibiotic ear drops, use  them as told by your health care provider. Do not stop using the antibiotic even if your condition improves.  Take over-the-counter and prescription medicines only as told by your health care provider.  Avoid getting water in your ears as told by your health care provider. This may include avoiding swimming or water sports for a few days.  Keep all follow-up visits as told by your health care provider. This is important. How is this prevented?  Keep your ears dry. Use the corner of a towel to dry your ears after you swim or bathe.  Avoid scratching or putting things in your ear. Doing these things can damage the ear canal or remove the protective wax that lines it, which makes it easier for bacteria and funguses to grow.  Avoid swimming in lakes, polluted water, or pools that may not have enough chlorine. Contact a health care provider if:  You have a fever.  Your ear is still red,  swollen, painful, or draining pus after 3 days.  Your redness, swelling, or pain gets worse.  You have a severe headache.  You have redness, swelling, pain, or tenderness in the area behind your ear. Summary  Otitis externa is an infection of the outer ear canal.  Common causes include swimming in dirty water, moisture in the ear, or a cut or scrape in the ear.  Symptoms include pain, redness, and swelling of the ear.  If you were prescribed antibiotic ear drops, use them as told by your health care provider. Do not stop using the antibiotic even if your condition improves. This information is not intended to replace advice given to you by your health care provider. Make sure you discuss any questions you have with your health care provider. Document Released: 12/15/2005 Document Revised: 05/21/2018 Document Reviewed: 05/21/2018 Elsevier Patient Education  [employer].   If you have lab work done today you will be contacted with your lab results within the next 2 weeks.  If you have not heard from [place] then please contact us. The fastest way to get your results is to register for My Chart.   IF you received an x-ray today, you will receive an invoice from [facility]. Please contact [facility] at [phone] with questions or concerns regarding your invoice.   IF you received labwork today, you will receive an invoice from [place]. Please contact [facility] at [phone] with questions or concerns regarding your invoice.   Our billing staff will not be able to assist you with questions regarding bills from these companies.  You will be contacted with the lab results as soon as they are available. The fastest way to get your results is to activate your My Chart account. Instructions are located on the last page of this paperwork. If you have not heard from [place] regarding the results in 2 weeks, please contact this office.      Signed,   Merri Ray, MD  Primary Care at [place].  07/25/19 7:14 PM

## 2019-07-25 NOTE — Patient Instructions (Addendum)
Symbicort refilled at same dose for now, but if you continue to require albuterol frequently I would discuss with your allergist if other medicines need to be started, or would consider prednisone taper.  Can follow-up on this in the next few days.  For  otitis externa, which appears to be worse in the right than the left, start antibiotic drops 4 times per day and recheck in the next 2 days to make sure that is treating that infection sufficiently.  If any fevers or worsening symptoms be seen sooner.   Otitis Externa  Otitis externa is an infection of the outer ear canal. The outer ear canal is the area between the outside of the ear and the eardrum. Otitis externa is sometimes called swimmer's ear. What are the causes? Common causes of this condition include:  Swimming in dirty water.  Moisture in the ear.  An injury to the inside of the ear.  An object stuck in the ear.  A cut or scrape on the outside of the ear. What increases the risk? You are more likely to develop this condition if you go swimming often. What are the signs or symptoms? The first symptom of this condition is often itching in the ear. Later symptoms of the condition include:  Swelling of the ear.  Redness in the ear.  Ear pain. The pain may get worse when you pull on your ear.  Pus coming from the ear. How is this diagnosed? This condition may be diagnosed by examining the ear and testing fluid from the ear for bacteria and funguses. How is this treated? This condition may be treated with:  Antibiotic ear drops. These are often given for 10-14 days.  Medicines to reduce itching and swelling. Follow these instructions at home:  If you were prescribed antibiotic ear drops, use them as told by your health care provider. Do not stop using the antibiotic even if your condition improves.  Take over-the-counter and prescription medicines only as told by your health care provider.  Avoid getting water in  your ears as told by your health care provider. This may include avoiding swimming or water sports for a few days.  Keep all follow-up visits as told by your health care provider. This is important. How is this prevented?  Keep your ears dry. Use the corner of a towel to dry your ears after you swim or bathe.  Avoid scratching or putting things in your ear. Doing these things can damage the ear canal or remove the protective wax that lines it, which makes it easier for bacteria and funguses to grow.  Avoid swimming in lakes, polluted water, or pools that may not have enough chlorine. Contact a health care provider if:  You have a fever.  Your ear is still red, swollen, painful, or draining pus after 3 days.  Your redness, swelling, or pain gets worse.  You have a severe headache.  You have redness, swelling, pain, or tenderness in the area behind your ear. Summary  Otitis externa is an infection of the outer ear canal.  Common causes include swimming in dirty water, moisture in the ear, or a cut or scrape in the ear.  Symptoms include pain, redness, and swelling of the ear.  If you were prescribed antibiotic ear drops, use them as told by your health care provider. Do not stop using the antibiotic even if your condition improves. This information is not intended to replace advice given to you by your health care  provider. Make sure you discuss any questions you have with your health care provider. Document Released: 12/15/2005 Document Revised: 05/21/2018 Document Reviewed: 05/21/2018 Elsevier Patient Education  [employer].   If you have lab work done today you will be contacted with your lab results within the next 2 weeks.  If you have not heard from [place] then please contact us. The fastest way to get your results is to register for My Chart.   IF you received an x-ray today, you will receive an invoice from [facility]. Please contact [facility] at  [phone] with questions or concerns regarding your invoice.   IF you received labwork today, you will receive an invoice from [place]. Please contact [facility] at [phone] with questions or concerns regarding your invoice.   Our billing staff will not be able to assist you with questions regarding bills from these companies.  You will be contacted with the lab results as soon as they are available. The fastest way to get your results is to activate your My Chart account. Instructions are located on the last page of this paperwork. If you have not heard from [place] regarding the results in 2 weeks, please contact this office.

## 2019-07-27 ENCOUNTER — Encounter: Payer: Self-pay | Admitting: Emergency Medicine

## 2019-07-27 ENCOUNTER — Other Ambulatory Visit: Payer: Self-pay

## 2019-07-27 ENCOUNTER — Ambulatory Visit (INDEPENDENT_AMBULATORY_CARE_PROVIDER_SITE_OTHER): Payer: Self-pay | Admitting: Emergency Medicine

## 2019-07-27 VITALS — BP 136/77 | HR 82 | Temp 98.5°F | Resp 16 | Wt 211.2 lb

## 2019-07-27 DIAGNOSIS — H9203 Otalgia, bilateral: Secondary | ICD-10-CM

## 2019-07-27 DIAGNOSIS — H60393 Other infective otitis externa, bilateral: Secondary | ICD-10-CM

## 2019-07-27 LAB — NOVEL CORONAVIRUS, NAA: SARS-CoV-2, NAA: NOT DETECTED

## 2019-07-27 MED ORDER — FLUCONAZOLE 150 MG PO TABS: 150.0000 mg | ORAL_TABLET | Freq: Once | ORAL | 0 refills | Status: AC

## 2019-07-27 MED ORDER — CIPROFLOXACIN HCL 500 MG PO TABS: 500.0000 mg | ORAL_TABLET | Freq: Two times a day (BID) | ORAL | 0 refills | Status: AC

## 2019-07-27 MED ORDER — HYDROCODONE-ACETAMINOPHEN 5-325 MG PO TABS: 1.0000 | ORAL_TABLET | Freq: Four times a day (QID) | ORAL | 0 refills | Status: AC | PRN

## 2019-07-27 NOTE — Progress Notes (Signed)
Deborah Cordova 48 y.o.   Chief Complaint  Patient presents with  . Ear Pain    Bilateral - per patient drainage started this morning after the popping sound and patient was seen 07/25/2019  by Dr Carlota Raspberry    HISTORY OF PRESENT ILLNESS: This is a 48 y.o. female complaining of bilateral ear pain that started several days ago.  Seen on 07/25/2019 and started on Cortisporin otic solution for bilateral otitis externa.  No history of diabetes.  No history of trauma.  Complaining of increased pain and drainage from the right ear.  Denies fever or chills.  Denies any other significant symptoms.  HPI   Prior to Admission medications   Medication Sig Start Date End Date Taking? Authorizing Provider  acyclovir (ZOVIRAX) 400 MG tablet Take 400 mg by mouth as needed (For fever blisters.).   Yes [provider]  albuterol (PROAIR HFA) 108 (90 Base) MCG/ACT inhaler Inhale 2 puffs into the lungs every 4 (four) hours as needed for wheezing or shortness of breath. 07/25/19  Yes Wendie Agreste, MD  amLODipine (NORVASC) 5 MG tablet Take 1 tablet (5 mg total) by mouth daily. 12/14/17  Yes Forrest Moron, MD  budesonide-formoterol (SYMBICORT) 160-4.5 MCG/ACT inhaler Take 2 puffs first thing in am and then another 2 puffs about 12 hours later. 07/25/19  Yes Wendie Agreste, MD  Butalbital-APAP-Caffeine 50-300-40 MG CAPS Take by mouth as needed.   Yes [provider]  cetirizine (ZYRTEC ALLERGY) 10 MG tablet Take 10 mg by mouth daily.   Yes [provider]  cyclobenzaprine (FLEXERIL) 10 MG tablet Take 1 tablet (10 mg total) by mouth 3 (three) times daily as needed for muscle spasms. 08/16/18  Yes McVey, Gelene Mink, PA-C  EPINEPHrine 0.3 mg/0.3 mL IJ SOAJ injection  05/06/18  Yes [provider]  fluticasone (FLONASE) 50 MCG/ACT nasal spray Place 1 spray into both nostrils 2 (two) times daily. 05/06/18  Yes Rutherford Guys, MD  gabapentin (NEURONTIN) 300 MG capsule Take 1  capsule (300 mg total) by mouth at bedtime. 09/29/17  Yes Stallings, Zoe A, MD  ipratropium (ATROVENT) 0.03 % nasal spray Place into the nose. 05/07/17  Yes [provider]  montelukast (SINGULAIR) 10 MG tablet Take 1 tablet (10 mg total) by mouth at bedtime. 07/25/19  Yes Wendie Agreste, MD  neomycin-polymyxin-hydrocortisone (CORTISPORIN) OTIC solution Place 4 drops into both ears 4 (four) times daily. 07/25/19  Yes Wendie Agreste, MD  Olopatadine HCl 0.2 % SOLN Apply 1 drop to eye daily. 08/06/18  Yes Forrest Moron, MD  omalizumab Arvid Right) 150 MG injection Inject 300 mg under the skin every 2 weeks. 05/12/18  Yes Tresa Garter, MD  OVER THE COUNTER MEDICATION    Yes [provider]  triamcinolone ointment (KENALOG) 0.1 % Apply 1 application topically 2 (two) times daily. 06/01/18  Yes Harrison Mons, PA  Water For Injection Sterile (STERILE WATER, PRESERVATIVE FREE,) injection  05/10/18  Yes [provider]  ranitidine (ZANTAC) 150 MG tablet Take 1 tablet (150 mg total) by mouth 2 (two) times daily. Patient not taking: Reported on 07/27/2019 02/03/18   Forrest Moron, MD    Allergies  Allergen Reactions  . Aspirin Other (See Comments) and Shortness Of Breath    Asthma flare up - wheezing Asthma attack  . Nsaids Other (See Comments) and Shortness Of Breath    Asthma flare up - wheezing  Asthma attack  . Sulfamethoxazole-Trimethoprim Hives, Swelling, Rash  and Other (See Comments)    Whelps and knots   . Tolmetin Shortness Of Breath    Asthma flare up - wheezing  . Cantaloupe (Diagnostic) Itching and Other (See Comments)    Makes tongue itch  . Other Other (See Comments)    Pecans and walnuts---induce asthma attack   . Pineapple Other (See Comments)    Tongue bleeds  . Kiwi Extract Itching and Other (See Comments)    Makes tongue itch Makes tongue itch  . Pecan Extract Allergy Skin Test Itching    Pecans and walnuts---induce asthma attack   .  Phenazopyridine Hcl Itching    Itching, swelling and burning Itching, swelling and burning    Patient Active Problem List   Diagnosis Date Noted  . Restless leg 09/06/2018  . Severe persistent asthma without complication   . Essential hypertension 10/01/2015  . Gastroesophageal reflux disease without esophagitis 10/01/2015    Past Medical History:  Diagnosis Date  . Asthma   . COPD (chronic obstructive pulmonary disease) ([place])   . Fibroid   . Genital warts   . Hypertension   . PID (pelvic inflammatory disease)   . Renal disorder     Past Surgical History:  Procedure Laterality Date  . COLPOSCOPY    . DILATION AND CURETTAGE OF UTERUS    . LAPAROSCOPIC TOTAL HYSTERECTOMY    . laser genital warts      Social History   Socioeconomic History  . Marital status: Married    Spouse name: Not on file  . Number of children: 4  . Years of education: Not on file  . Highest education level: Not on file  Occupational History  . Not on file  Social Needs  . Financial resource strain: Not on file  . Food insecurity    Worry: Not on file    Inability: Not on file  . Transportation needs    Medical: Not on file    Non-medical: Not on file  Tobacco Use  . Smoking status: Never Smoker  . Smokeless tobacco: Never Used  Substance and Sexual Activity  . Alcohol use: Yes    Alcohol/week: 1.0 - 3.0 standard drinks    Types: 1 - 3 Glasses of wine per week  . Drug use: No  . Sexual activity: Yes    Partners: Male    Birth control/protection: Surgical    Comment: hysterectomy  Lifestyle  . Physical activity    Days per week: Not on file    Minutes per session: Not on file  . Stress: Not on file  Relationships  . Social Herbalist on phone: Not on file    Gets together: Not on file    Attends religious service: Not on file    Active member of club or organization: Not on file    Attends meetings of clubs or organizations: Not on file    Relationship status: Not  on file  . Intimate partner violence    Fear of current or ex partner: Not on file    Emotionally abused: Not on file    Physically abused: Not on file    Forced sexual activity: Not on file  Other Topics Concern  . Not on file  Social History Narrative  . Not on file    Family History  Problem Relation Age of Onset  . Depression Mother   . Diabetes Mother   . Hepatitis C Mother   . Hypertension Mother   .  Asthma Mother   . COPD Father   . Emphysema Father        smoked  . Hepatitis C Father   . Asthma Sister   . Breast cancer Sister   . Asthma Brother   . Diabetes Brother   . Hyperlipidemia Brother   . Stroke Maternal Grandmother   . Cancer Maternal Grandfather   . Cancer Paternal Grandmother   . Cancer Paternal Grandfather      Review of Systems  Constitutional: Negative.  Negative for chills and fever.  HENT: Positive for ear discharge and ear pain. Negative for congestion, sinus pain and sore throat.   Eyes: Negative for discharge and redness.  Respiratory: Negative.  Negative for shortness of breath.   Cardiovascular: Negative.  Negative for chest pain and palpitations.  Gastrointestinal: Negative for abdominal pain, nausea and vomiting.  Musculoskeletal: Negative.  Negative for myalgias.  Skin: Negative.  Negative for rash.  Neurological: Negative for dizziness and headaches.    Vitals:   07/27/19 1619  BP: 136/77  Pulse: 82  Resp: 16  Temp: 98.5 F (36.9 C)  SpO2: 97%    Physical Exam Vitals signs reviewed.  Constitutional:      Appearance: Normal appearance.  HENT:     Head: Normocephalic and atraumatic.     Right Ear: Drainage, swelling and tenderness present. No mastoid tenderness.     Left Ear: Tympanic membrane normal. Swelling and tenderness present. No drainage. No mastoid tenderness.  Cardiovascular:     Rate and Rhythm: Normal rate and regular rhythm.     Heart sounds: Normal heart sounds.  Pulmonary:     Effort: Pulmonary effort is  normal.     Breath sounds: Normal breath sounds.  Musculoskeletal: Normal range of motion.  Skin:    General: Skin is warm and dry.     Capillary Refill: Capillary refill takes less than 2 seconds.  Neurological:     General: No focal deficit present.     Mental Status: She is alert and oriented to person, place, and time.      ASSESSMENT & PLAN: Kailoni was seen today for ear pain.  Diagnoses and all orders for this visit:  Infective otitis externa of both ears -     ciprofloxacin (CIPRO) 500 MG tablet; Take 1 tablet (500 mg total) by mouth 2 (two) times daily for 7 days.  Otalgia of both ears -     HYDROcodone-acetaminophen (NORCO) 5-325 MG tablet; Take 1 tablet by mouth every 6 (six) hours as needed.  Other orders -     fluconazole (DIFLUCAN) 150 MG tablet; Take 1 tablet (150 mg total) by mouth once for 1 dose. Repeat in 1 week.    Patient Instructions       If you have lab work done today you will be contacted with your lab results within the next 2 weeks.  If you have not heard from [place] then please contact us. The fastest way to get your results is to register for My Chart.   IF you received an x-ray today, you will receive an invoice from [facility]. Please contact [facility] at [phone] with questions or concerns regarding your invoice.   IF you received labwork today, you will receive an invoice from [place]. Please contact [facility] at [phone] with questions or concerns regarding your invoice.   Our billing staff will not be able to assist you with questions regarding bills from these companies.  You will be contacted with  the lab results as soon as they are available. The fastest way to get your results is to activate your My Chart account. Instructions are located on the last page of this paperwork. If you have not heard from [place] regarding the results in 2 weeks, please contact this office.     Otitis Externa  Otitis externa is an  infection of the outer ear canal. The outer ear canal is the area between the outside of the ear and the eardrum. Otitis externa is sometimes called swimmer's ear. What are the causes? Common causes of this condition include:  Swimming in dirty water.  Moisture in the ear.  An injury to the inside of the ear.  An object stuck in the ear.  A cut or scrape on the outside of the ear. What increases the risk? You are more likely to get this condition if you go swimming often. What are the signs or symptoms?  Itching in the ear. This is often the first symptom.  Swelling of the ear.  Redness in the ear.  Ear pain. The pain may get worse when you pull on your ear.  Pus coming from the ear. How is this treated? This condition may be treated with:  Antibiotic ear drops. These are often given for 10-14 days.  Medicines to reduce itching and swelling. Follow these instructions at home:  If you were given antibiotic ear drops, use them as told by your doctor. Do not stop using them even if your condition gets better.  Take over-the-counter and prescription medicines only as told by your doctor.  Avoid getting water in your ears as told by your doctor. You may be told to avoid swimming or water sports for a few days.  Keep all follow-up visits as told by your doctor. This is important. How is this prevented?  Keep your ears dry. Use the corner of a towel to dry your ears after you swim or bathe.  Try not to scratch or put things in your ear. Doing these things makes it easier for germs to grow in your ear.  Avoid swimming in lakes, dirty water, or pools that may not have the right amount of a chemical called chlorine. Contact a doctor if:  You have a fever.  Your ear is still red, swollen, or painful after 3 days.  You still have pus coming from your ear after 3 days.  Your redness, swelling, or pain gets worse.  You have a really bad headache.  You have redness,  swelling, pain, or tenderness behind your ear. Summary  Otitis externa is an infection of the outer ear canal.  Symptoms include pain, redness, and swelling of the ear.  If you were given antibiotic ear drops, use them as told by your doctor. Do not stop using them even if your condition gets better.  Try not to scratch or put things in your ear. This information is not intended to replace advice given to you by your health care provider. Make sure you discuss any questions you have with your health care provider. Document Released: 06/02/2008 Document Revised: 05/21/2018 Document Reviewed: 05/21/2018 Elsevier Patient Education  2020 Elsevier Inc.      Agustina Caroli, MD [facility]

## 2019-07-27 NOTE — Patient Instructions (Addendum)
If you have lab work done today you will be contacted with your lab results within the next 2 weeks.  If you have not heard from [place] then please contact us. The fastest way to get your results is to register for My Chart.   IF you received an x-ray today, you will receive an invoice from [facility]. Please contact [facility] at [phone] with questions or concerns regarding your invoice.   IF you received labwork today, you will receive an invoice from [place]. Please contact [facility] at [phone] with questions or concerns regarding your invoice.   Our billing staff will not be able to assist you with questions regarding bills from these companies.  You will be contacted with the lab results as soon as they are available. The fastest way to get your results is to activate your My Chart account. Instructions are located on the last page of this paperwork. If you have not heard from [place] regarding the results in 2 weeks, please contact this office.     Otitis Externa  Otitis externa is an infection of the outer ear canal. The outer ear canal is the area between the outside of the ear and the eardrum. Otitis externa is sometimes called swimmer's ear. What are the causes? Common causes of this condition include:  Swimming in dirty water.  Moisture in the ear.  An injury to the inside of the ear.  An object stuck in the ear.  A cut or scrape on the outside of the ear. What increases the risk? You are more likely to get this condition if you go swimming often. What are the signs or symptoms?  Itching in the ear. This is often the first symptom.  Swelling of the ear.  Redness in the ear.  Ear pain. The pain may get worse when you pull on your ear.  Pus coming from the ear. How is this treated? This condition may be treated with:  Antibiotic ear drops. These are often given for 10-14 days.  Medicines to reduce itching and swelling. Follow these  instructions at home:  If you were given antibiotic ear drops, use them as told by your doctor. Do not stop using them even if your condition gets better.  Take over-the-counter and prescription medicines only as told by your doctor.  Avoid getting water in your ears as told by your doctor. You may be told to avoid swimming or water sports for a few days.  Keep all follow-up visits as told by your doctor. This is important. How is this prevented?  Keep your ears dry. Use the corner of a towel to dry your ears after you swim or bathe.  Try not to scratch or put things in your ear. Doing these things makes it easier for germs to grow in your ear.  Avoid swimming in lakes, dirty water, or pools that may not have the right amount of a chemical called chlorine. Contact a doctor if:  You have a fever.  Your ear is still red, swollen, or painful after 3 days.  You still have pus coming from your ear after 3 days.  Your redness, swelling, or pain gets worse.  You have a really bad headache.  You have redness, swelling, pain, or tenderness behind your ear. Summary  Otitis externa is an infection of the outer ear canal.  Symptoms include pain, redness, and swelling of the ear.  If you were given antibiotic ear drops, use them  as told by your doctor. Do not stop using them even if your condition gets better.  Try not to scratch or put things in your ear. This information is not intended to replace advice given to you by your health care provider. Make sure you discuss any questions you have with your health care provider. Document Released: 06/02/2008 Document Revised: 05/21/2018 Document Reviewed: 05/21/2018 Elsevier Patient Education  2020 [employer].

## 2019-07-28 ENCOUNTER — Ambulatory Visit: Payer: Self-pay | Admitting: Family Medicine

## 2019-09-09 ENCOUNTER — Other Ambulatory Visit: Payer: Self-pay | Admitting: Family Medicine

## 2019-09-09 DIAGNOSIS — J4541 Moderate persistent asthma with (acute) exacerbation: Secondary | ICD-10-CM

## 2019-09-09 NOTE — Telephone Encounter (Signed)
Medication: albuterol (PROAIR HFA) 108 (90 Base) MCG/ACT inhaler   Patient is requesting a refill of this medication. Pharmacy states that multiple requests have been sent via fax.    Pharmacy:  [facility] [id number], [place]. [phone] (Phone) [phone] (Fax)

## 2019-09-09 NOTE — Telephone Encounter (Signed)
Requested medication (s) are due for refill today: yes  Requested medication (s) are on the active medication list: yes  Last refill:  07/25/2019  Future visit scheduled: no  Notes to clinic:  Review for refill   Requested Prescriptions  Pending Prescriptions Disp Refills   albuterol (PROAIR HFA) 108 (90 Base) MCG/ACT inhaler 18 g 1    Sig: Inhale 2 puffs into the lungs every 4 (four) hours as needed for wheezing or shortness of breath.     Pulmonology:  Beta Agonists Failed - 09/09/2019  9:32 AM      Failed - One inhaler should last at least one month. If the patient is requesting refills earlier, contact the patient to check for uncontrolled symptoms.      Passed - Valid encounter within last 12 months    Recent Outpatient Visits          1 month ago Infective otitis externa of both ears   Primary Care at [facility], [place], MD   1 month ago Close Exposure to Covid-19 Virus   [facility] at [facility], Ranell Patrick, MD   11 months ago Encounter for removal of sutures   Primary Care at [facility], Gelene Mink, PA-C   1 year ago Pre-operative clearance   Primary Care at [place], PA-C   1 year ago Chronic right-sided low back pain without sciatica   Primary Care at [facility], Gelene Mink, [place]

## 2019-09-12 MED ORDER — ALBUTEROL SULFATE HFA 108 (90 BASE) MCG/ACT IN AERS: 2.0000 | INHALATION_SPRAY | RESPIRATORY_TRACT | 0 refills | Status: AC | PRN

## 2019-09-26 MED FILL — IPRAT-ALBUT 0.5-3(2.5) MG/3: 0.5-2.5 (3) | 10 days supply | Qty: 90 | Fill #0

## 2019-09-28 MED FILL — COMBIVENT RESPIMAT INHAL SP: 20-100 | 30 days supply | Qty: 4 | Fill #0 | Status: TO

## 2021-06-04 ENCOUNTER — Other Ambulatory Visit (HOSPITAL_BASED_OUTPATIENT_CLINIC_OR_DEPARTMENT_OTHER): Payer: Self-pay
